# Patient Record
Sex: Male | Born: 1970 | Race: White | Hispanic: No | Marital: Married | Smoking: Never smoker
Health system: Southern US, Community
[De-identification: ages and names within clinical notes are randomized; demographics above are authoritative.]

## PROBLEM LIST (undated history)

## (undated) DIAGNOSIS — I10 Essential (primary) hypertension: Secondary | ICD-10-CM

## (undated) DIAGNOSIS — K219 Gastro-esophageal reflux disease without esophagitis: Secondary | ICD-10-CM

## (undated) HISTORY — PX: SMALL INTESTINE SURGERY: SHX150

---

## 2020-04-23 ENCOUNTER — Inpatient Hospital Stay (HOSPITAL_COMMUNITY)
Admission: AD | Admit: 2020-04-23 | Discharge: 2020-04-26 | DRG: 872 | Disposition: A | Discharge status: Home / Self Care | Payer: BC Managed Care – PPO | Source: Other Acute Inpatient Hospital | Attending: Internal Medicine | Admitting: Internal Medicine

## 2020-04-23 DIAGNOSIS — M5416 Radiculopathy, lumbar region: Secondary | ICD-10-CM | POA: Diagnosis present

## 2020-04-23 DIAGNOSIS — F1090 Alcohol use, unspecified, uncomplicated: Secondary | ICD-10-CM

## 2020-04-23 DIAGNOSIS — K219 Gastro-esophageal reflux disease without esophagitis: Secondary | ICD-10-CM | POA: Diagnosis present

## 2020-04-23 DIAGNOSIS — R748 Abnormal levels of other serum enzymes: Secondary | ICD-10-CM | POA: Diagnosis present

## 2020-04-23 DIAGNOSIS — R234 Changes in skin texture: Secondary | ICD-10-CM | POA: Diagnosis present

## 2020-04-23 DIAGNOSIS — Z7289 Other problems related to lifestyle: Secondary | ICD-10-CM

## 2020-04-23 DIAGNOSIS — F1020 Alcohol dependence, uncomplicated: Secondary | ICD-10-CM | POA: Diagnosis present

## 2020-04-23 DIAGNOSIS — A419 Sepsis, unspecified organism: Secondary | ICD-10-CM | POA: Diagnosis present

## 2020-04-23 DIAGNOSIS — N492 Inflammatory disorders of scrotum: Secondary | ICD-10-CM | POA: Diagnosis present

## 2020-04-23 DIAGNOSIS — Z789 Other specified health status: Secondary | ICD-10-CM

## 2020-04-23 DIAGNOSIS — N433 Hydrocele, unspecified: Secondary | ICD-10-CM | POA: Diagnosis present

## 2020-04-23 DIAGNOSIS — K7689 Other specified diseases of liver: Secondary | ICD-10-CM | POA: Diagnosis present

## 2020-04-23 DIAGNOSIS — N5089 Other specified disorders of the male genital organs: Secondary | ICD-10-CM

## 2020-04-23 DIAGNOSIS — K5900 Constipation, unspecified: Secondary | ICD-10-CM | POA: Diagnosis present

## 2020-04-23 DIAGNOSIS — D72829 Elevated white blood cell count, unspecified: Secondary | ICD-10-CM | POA: Diagnosis not present

## 2020-04-23 DIAGNOSIS — I10 Essential (primary) hypertension: Secondary | ICD-10-CM | POA: Diagnosis present

## 2020-04-23 HISTORY — DX: Gastro-esophageal reflux disease without esophagitis: K21.9

## 2020-04-23 HISTORY — DX: Essential (primary) hypertension: I10

## 2020-04-23 NOTE — H&P (Signed)
History and Physical    Andrew Roberts NWG:[id number] DOB: 05/10/1970 DOA: 04/23/2020  PCP: No primary care provider on file. Patient coming from: [facility]  Chief Complaint: Scrotal pain and swelling  HPI: Andrew Roberts is a 50 y.o. male with medical history significant of hypertension, GERD presented to [facility] ED today with complaints of worsening swelling, erythema, and pain of his scrotum.  He was seen by his PCP several days ago for suspected simple abscess/cellulitis and placed on antibiotics but the infection continued to worsen.   At [facility], afebrile but tachycardic.  Not hypotensive.  Labs showing WBC 25.0, hemoglobin 14.0, platelet count 179K.  Sodium 133, potassium 4.8, chloride 99, bicarb 29, BUN 16, creatinine 1.0, glucose 115.  Lactic acid 1.9.  AST 99, ALT 216, alk phos 320, T bili 1.5.  UA with negative nitrite, negative leukocytes, 2 WBCs, and few bacteria.  Screening Covid and influenza test negative.   CT abdomen pelvis with contrast done at [facility] showing findings compatible with acute scrotal cellulitis with marked edema and skin thickening.  Associated reactive hydrocele.  Left hemiscrotal ill-defined fluid with enhancement, may represent reactive hydrocele or phlegmon versus early abscess.  Ultrasound recommended for further evaluation.  Scrotal ultrasound was done and revealed marked changes of right than left scrotal skin thickening with complex mixed echogenicity edema/ill-defined tracking scrotal wall fluid compatible with cellulitis and phlegmon reaction.  No defined fluid collection.  No testicular abnormality or evidence of torsion.  Small reactive right hydrocele.  Patient was given 1 L normal saline bolus, hydromorphone, vancomycin, morphine, and Zofran.  ED physician at [facility] discussed the case with on-call urologist Dr. Charlesetta Shanks at [facility] who recommended continuing IV antibiotic but if any signs of decompensation then  reimage and reconsult urology.  Patient states about 10 days ago he noticed an ingrown hair on his scrotum which he tried to remove on his own and clean the area with hydrogen peroxide.  The following day he noticed a small bump in this area.  Since then he has continued to have worsening erythema, pain, and swelling.  States last week he was seen at a clinic and prescribed Bactrim but it did not help and his infection has continued to worsen.  Denies dysuria.  He had chills yesterday but did not check his temperature. No nausea or vomiting.  He has received 1 dose of Moderna Covid vaccine.  Denies cough or shortness of breath.  He reports history of lumbar radiculopathy for which he is seen by orthopedics and receiving steroid injections.  Review of Systems:  All systems reviewed and apart from history of presenting illness, are negative.  Past Medical History:  Diagnosis Date  . GERD (gastroesophageal reflux disease)   . HTN (hypertension)     Past Surgical History:  Procedure Laterality Date  . SMALL INTESTINE SURGERY       reports that he has never smoked. He has never used smokeless tobacco. He reports current alcohol use. He reports current drug use. Drug: Marijuana.  Allergies  Allergen Reactions  . Aspirin     Other reaction(s): Other Ulcers  ulcers Ulcers      History reviewed. No pertinent family history.  Prior to Admission medications   Not on File    Physical Exam: Vitals:   04/23/20 2300  BP: (!) 151/86  Pulse: 99  Resp: 18  Temp: 98.7 F (37.1 C)  TempSrc: Oral  SpO2: 99%    Physical Exam Constitutional:  General: He is not in acute distress. HENT:     Head: Normocephalic and atraumatic.  Eyes:     Extraocular Movements: Extraocular movements intact.     Conjunctiva/sclera: Conjunctivae normal.  Cardiovascular:     Rate and Rhythm: Normal rate and regular rhythm.     Pulses: Normal pulses.  Pulmonary:     Effort: Pulmonary effort is  normal. No respiratory distress.     Breath sounds: Normal breath sounds. No wheezing or rales.  Abdominal:     General: Bowel sounds are normal. There is no distension.     Palpations: Abdomen is soft.     Tenderness: There is no abdominal tenderness.  Genitourinary:    Comments: Chaperone at bedside (nurse tech) Scrotum appears erythematous and edematous.  Erythema extending to perineal region and inner thigh Musculoskeletal:        General: No swelling or tenderness.     Cervical back: Normal range of motion and neck supple.  Skin:    General: Skin is warm and dry.  Neurological:     General: No focal deficit present.     Mental Status: He is alert and oriented to person, place, and time.     Labs on Admission: I have personally reviewed following labs and imaging studies  CBC: No results for input(s): WBC, NEUTROABS, HGB, HCT, MCV, PLT in the last 168 hours. Basic Metabolic Panel: No results for input(s): NA, K, CL, CO2, GLUCOSE, BUN, CREATININE, CALCIUM, MG, PHOS in the last 168 hours. GFR: CrCl cannot be calculated (No successful lab value found.). Liver Function Tests: No results for input(s): AST, ALT, ALKPHOS, BILITOT, PROT, ALBUMIN in the last 168 hours. No results for input(s): LIPASE, AMYLASE in the last 168 hours. No results for input(s): AMMONIA in the last 168 hours. Coagulation Profile: No results for input(s): INR, PROTIME in the last 168 hours. Cardiac Enzymes: No results for input(s): CKTOTAL, CKMB, CKMBINDEX, TROPONINI in the last 168 hours. BNP (last 3 results) No results for input(s): PROBNP in the last 8760 hours. HbA1C: No results for input(s): HGBA1C in the last 72 hours. CBG: No results for input(s): GLUCAP in the last 168 hours. Lipid Profile: No results for input(s): CHOL, HDL, LDLCALC, TRIG, CHOLHDL, LDLDIRECT in the last 72 hours. Thyroid Function Tests: No results for input(s): TSH, T4TOTAL, FREET4, T3FREE, THYROIDAB in the last 72  hours. Anemia Panel: No results for input(s): VITAMINB12, FOLATE, FERRITIN, TIBC, IRON, RETICCTPCT in the last 72 hours. Urine analysis: No results found for: COLORURINE, APPEARANCEUR, LABSPEC, PHURINE, GLUCOSEU, HGBUR, BILIRUBINUR, KETONESUR, PROTEINUR, UROBILINOGEN, NITRITE, LEUKOCYTESUR  Radiological Exams on Admission: No results found.  Assessment/Plan Principal Problem:   Cellulitis, scrotum Active Problems:   Sepsis ([place])   Elevated liver enzymes   Alcohol use   Essential hypertension   Sepsis secondary to acute scrotal cellulitis with phlegmon: Meets criteria for sepsis with tachycardia and leukocytosis.  No lactic acidosis or hypotension to suggest severe sepsis.  Scrotum is erythematous and edematous, erythema extending to perineal region and inner thigh.  UA not suggestive of infection.  Scrotal ultrasound done at [facility] showing marked changes of right than left scrotal skin thickening with complex mixed echogenicity edema/ill-defined tracking scrotal wall fluid compatible with cellulitis and phlegmon reaction; no defined fluid collection.  CT done at [facility] showing no findings to suggest Fournier's gangrene. ED physician at [facility] discussed the case with on-call urologist Dr. Charlesetta Shanks at [facility] who recommended continuing IV antibiotic but if any signs of decompensation  then reimage and reconsult urology. -Continue vancomycin.  Repeat labs to check lactate and WBC count.  Order blood culture x2.  IV fluid hydration, pain management.  Elevated liver enzymes: AST 99, ALT 216, alk phos 320, T bili 1.5.  Patient reports history of daily alcohol use. -Right upper quadrant ultrasound  Alcohol use disorder: Patient reports drinking a pint of liquor every night.  Not displaying any signs of withdrawal at this time. -CIWA protocol; Ativan as needed.  Folate, thiamine, and multivitamin.  Hypertension: Stable. -Continue home losartan  Lumbar radiculopathy: Patient states  he seen by orthopedics on outpatient basis and is receiving steroid injections. -Continue home gabapentin, Zanaflex.  Outpatient follow-up with orthopedics.  DVT prophylaxis: Subcutaneous heparin Code Status: Full code Family Communication: No family at bedside. Disposition Plan: Status is: Inpatient  Remains inpatient appropriate because:Ongoing active pain requiring inpatient pain management and Inpatient level of care appropriate due to severity of illness   Dispo: The patient is from: Home              Anticipated d/c is to: Home              Patient currently is not medically stable to d/c.   Difficult to place patient No  Level of care: Telemetry  The medical decision making on this patient was of high complexity and the patient is at high risk for clinical deterioration, therefore this is a level 3 visit.  Shela Leff MD [facility]  If 7PM-7AM, please contact night-coverage [email or web address]  04/24/2020, 1:21 AM

## 2020-04-24 ENCOUNTER — Encounter (HOSPITAL_COMMUNITY): Payer: Self-pay | Admitting: Internal Medicine

## 2020-04-24 ENCOUNTER — Other Ambulatory Visit: Payer: Self-pay

## 2020-04-24 ENCOUNTER — Inpatient Hospital Stay (HOSPITAL_COMMUNITY): Payer: BC Managed Care – PPO

## 2020-04-24 DIAGNOSIS — D72829 Elevated white blood cell count, unspecified: Secondary | ICD-10-CM | POA: Diagnosis present

## 2020-04-24 DIAGNOSIS — A419 Sepsis, unspecified organism: Principal | ICD-10-CM | POA: Diagnosis present

## 2020-04-24 DIAGNOSIS — I10 Essential (primary) hypertension: Secondary | ICD-10-CM

## 2020-04-24 DIAGNOSIS — Z789 Other specified health status: Secondary | ICD-10-CM

## 2020-04-24 DIAGNOSIS — N492 Inflammatory disorders of scrotum: Secondary | ICD-10-CM | POA: Diagnosis present

## 2020-04-24 DIAGNOSIS — R748 Abnormal levels of other serum enzymes: Secondary | ICD-10-CM

## 2020-04-24 DIAGNOSIS — Z7289 Other problems related to lifestyle: Secondary | ICD-10-CM

## 2020-04-24 DIAGNOSIS — M5416 Radiculopathy, lumbar region: Secondary | ICD-10-CM | POA: Diagnosis present

## 2020-04-24 LAB — URINALYSIS, ROUTINE W REFLEX MICROSCOPIC
Bacteria, UA: NONE SEEN
Bilirubin Urine: NEGATIVE
Glucose, UA: NEGATIVE mg/dL
Ketones, ur: NEGATIVE mg/dL
Leukocytes,Ua: NEGATIVE
Nitrite: NEGATIVE
Protein, ur: 30 mg/dL — AB
Specific Gravity, Urine: 1.021 (ref 1.005–1.030)
pH: 6 (ref 5.0–8.0)

## 2020-04-24 LAB — CBC
HCT: 37.5 % — ABNORMAL LOW (ref 39.0–52.0)
Hemoglobin: 12.5 g/dL — ABNORMAL LOW (ref 13.0–17.0)
MCH: 31.9 pg (ref 26.0–34.0)
MCHC: 33.3 g/dL (ref 30.0–36.0)
MCV: 95.7 fL (ref 80.0–100.0)
Platelets: 170 10*3/uL (ref 150–400)
RBC: 3.92 MIL/uL — ABNORMAL LOW (ref 4.22–5.81)
RDW: 13.7 % (ref 11.5–15.5)
WBC: 19.4 10*3/uL — ABNORMAL HIGH (ref 4.0–10.5)
nRBC: 0 % (ref 0.0–0.2)

## 2020-04-24 LAB — COMPREHENSIVE METABOLIC PANEL
ALT: 144 U/L — ABNORMAL HIGH (ref 0–44)
AST: 52 U/L — ABNORMAL HIGH (ref 15–41)
Albumin: 2.8 g/dL — ABNORMAL LOW (ref 3.5–5.0)
Alkaline Phosphatase: 190 U/L — ABNORMAL HIGH (ref 38–126)
Anion gap: 7 (ref 5–15)
BUN: 15 mg/dL (ref 6–20)
CO2: 22 mmol/L (ref 22–32)
Calcium: 8.3 mg/dL — ABNORMAL LOW (ref 8.9–10.3)
Chloride: 104 mmol/L (ref 98–111)
Creatinine, Ser: 1.04 mg/dL (ref 0.61–1.24)
GFR, Estimated: >60 mL/min (ref >60)
Glucose, Bld: 153 mg/dL — ABNORMAL HIGH (ref 70–99)
Potassium: 4.2 mmol/L (ref 3.5–5.1)
Sodium: 133 mmol/L — ABNORMAL LOW (ref 135–145)
Total Bilirubin: 1 mg/dL (ref 0.3–1.2)
Total Protein: 6.1 g/dL — ABNORMAL LOW (ref 6.5–8.1)

## 2020-04-24 LAB — PHOSPHORUS: Phosphorus: 2.6 mg/dL (ref 2.5–4.6)

## 2020-04-24 LAB — MAGNESIUM: Magnesium: 2 mg/dL (ref 1.7–2.4)

## 2020-04-24 LAB — HIV ANTIBODY (ROUTINE TESTING W REFLEX): HIV Screen 4th Generation wRfx: NONREACTIVE

## 2020-04-24 LAB — LACTIC ACID, PLASMA: Lactic Acid, Venous: 1.2 mmol/L (ref 0.5–1.9)

## 2020-04-24 MED ORDER — SODIUM CHLORIDE 0.9 % IV SOLN
3.0000 g | Freq: Four times a day (QID) | INTRAVENOUS | Status: DC
  Administered 2020-04-24 – 2020-04-26 (×9): 3 g via INTRAVENOUS
  Filled 2020-04-24 (×2): qty 3
  Filled 2020-04-24: qty 8
  Filled 2020-04-24: qty 2.25
  Filled 2020-04-24: qty 3
  Filled 2020-04-24: qty 2.25
  Filled 2020-04-24: qty 3
  Filled 2020-04-24: qty 2.25
  Filled 2020-04-24: qty 3
  Filled 2020-04-24: qty 2.25

## 2020-04-24 MED ORDER — THIAMINE HCL 100 MG/ML IJ SOLN: 100.0000 mg | Freq: Every day | INTRAMUSCULAR | Status: DC

## 2020-04-24 MED ORDER — VANCOMYCIN HCL 750 MG/150ML IV SOLN
750.0000 mg | Freq: Two times a day (BID) | INTRAVENOUS | Status: DC
  Administered 2020-04-24 – 2020-04-25 (×3): 750 mg via INTRAVENOUS
  Filled 2020-04-24 (×4): qty 150

## 2020-04-24 MED ORDER — TRAMADOL HCL 50 MG PO TABS
50.0000 mg | ORAL_TABLET | Freq: Three times a day (TID) | ORAL | Status: DC
  Administered 2020-04-24 – 2020-04-25 (×6): 50 mg via ORAL
  Filled 2020-04-24 (×6): qty 1

## 2020-04-24 MED ORDER — LORAZEPAM 2 MG/ML IJ SOLN: 1.0000 mg | INTRAMUSCULAR | Status: DC | PRN

## 2020-04-24 MED ORDER — GABAPENTIN 300 MG PO CAPS
600.0000 mg | ORAL_CAPSULE | Freq: Two times a day (BID) | ORAL | Status: DC
  Administered 2020-04-24 – 2020-04-26 (×6): 600 mg via ORAL
  Filled 2020-04-24 (×7): qty 2

## 2020-04-24 MED ORDER — KETOROLAC TROMETHAMINE 30 MG/ML IJ SOLN: 30.0000 mg | Freq: Four times a day (QID) | INTRAMUSCULAR | Status: DC | PRN

## 2020-04-24 MED ORDER — SODIUM CHLORIDE 0.9 % IV BOLUS: 1000.0000 mL | Freq: Once | INTRAVENOUS | Status: DC

## 2020-04-24 MED ORDER — LOSARTAN POTASSIUM 50 MG PO TABS
100.0000 mg | ORAL_TABLET | Freq: Every day | ORAL | Status: DC
  Administered 2020-04-24 – 2020-04-26 (×3): 100 mg via ORAL
  Filled 2020-04-24 (×3): qty 2

## 2020-04-24 MED ORDER — LORAZEPAM 1 MG PO TABS: 1.0000 mg | ORAL_TABLET | ORAL | Status: DC | PRN

## 2020-04-24 MED ORDER — FOLIC ACID 1 MG PO TABS: 1.0000 mg | ORAL_TABLET | Freq: Every day | ORAL | Status: DC

## 2020-04-24 MED ORDER — SODIUM CHLORIDE 0.9 % IV SOLN: INTRAVENOUS | Status: AC

## 2020-04-24 MED ORDER — THIAMINE HCL 100 MG PO TABS
100.0000 mg | ORAL_TABLET | Freq: Every day | ORAL | Status: DC
  Administered 2020-04-25: 100 mg via ORAL
  Filled 2020-04-24 (×2): qty 1

## 2020-04-24 MED ORDER — SENNOSIDES-DOCUSATE SODIUM 8.6-50 MG PO TABS
1.0000 | ORAL_TABLET | Freq: Two times a day (BID) | ORAL | Status: DC
  Administered 2020-04-24 – 2020-04-26 (×4): 1 via ORAL
  Filled 2020-04-24 (×4): qty 1

## 2020-04-24 MED ORDER — FOLIC ACID 1 MG PO TABS
1.0000 mg | ORAL_TABLET | Freq: Every day | ORAL | Status: DC
  Administered 2020-04-24 – 2020-04-26 (×3): 1 mg via ORAL
  Filled 2020-04-24 (×3): qty 1

## 2020-04-24 MED ORDER — POLYETHYLENE GLYCOL 3350 17 G PO PACK: 17.0000 g | PACK | Freq: Two times a day (BID) | ORAL | Status: DC

## 2020-04-24 MED ORDER — POLYETHYLENE GLYCOL 3350 17 G PO PACK
17.0000 g | PACK | Freq: Every day | ORAL | Status: DC
  Administered 2020-04-24 – 2020-04-25 (×2): 17 g via ORAL
  Filled 2020-04-24 (×3): qty 1

## 2020-04-24 MED ORDER — PANTOPRAZOLE SODIUM 40 MG PO TBEC
40.0000 mg | DELAYED_RELEASE_TABLET | Freq: Every day | ORAL | Status: DC
  Administered 2020-04-24 – 2020-04-26 (×3): 40 mg via ORAL
  Filled 2020-04-24 (×3): qty 1

## 2020-04-24 MED ORDER — TIZANIDINE HCL 4 MG PO TABS: 4.0000 mg | ORAL_TABLET | Freq: Three times a day (TID) | ORAL | Status: DC | PRN

## 2020-04-24 MED ORDER — THIAMINE HCL 100 MG PO TABS
100.0000 mg | ORAL_TABLET | Freq: Every day | ORAL | Status: DC
  Administered 2020-04-24 – 2020-04-26 (×2): 100 mg via ORAL
  Filled 2020-04-24: qty 1

## 2020-04-24 MED ORDER — LORAZEPAM 2 MG/ML IJ SOLN: 0.0000 mg | Freq: Two times a day (BID) | INTRAMUSCULAR | Status: DC

## 2020-04-24 MED ORDER — HEPARIN SODIUM (PORCINE) 5000 UNIT/ML IJ SOLN
5000.0000 [IU] | Freq: Three times a day (TID) | INTRAMUSCULAR | Status: DC
  Administered 2020-04-24 – 2020-04-25 (×4): 5000 [IU] via SUBCUTANEOUS
  Filled 2020-04-24 (×4): qty 1

## 2020-04-24 MED ORDER — ADULT MULTIVITAMIN W/MINERALS CH
1.0000 | ORAL_TABLET | Freq: Every day | ORAL | Status: DC
  Administered 2020-04-25: 1 via ORAL
  Filled 2020-04-24 (×3): qty 1

## 2020-04-24 MED ORDER — LORAZEPAM 2 MG/ML IJ SOLN: 0.0000 mg | Freq: Four times a day (QID) | INTRAMUSCULAR | Status: AC

## 2020-04-24 MED ORDER — ADULT MULTIVITAMIN W/MINERALS CH
1.0000 | ORAL_TABLET | Freq: Every day | ORAL | Status: DC
  Administered 2020-04-24 – 2020-04-26 (×2): 1 via ORAL
  Filled 2020-04-24 (×2): qty 1

## 2020-04-24 MED ORDER — SORBITOL 70 % SOLN
30.0000 mL | Freq: Once | Status: AC
  Administered 2020-04-24: 30 mL via ORAL
  Filled 2020-04-24: qty 30

## 2020-04-24 MED ORDER — MORPHINE SULFATE (PF) 2 MG/ML IV SOLN
1.0000 mg | INTRAVENOUS | Status: DC | PRN
  Administered 2020-04-24 – 2020-04-26 (×8): 1 mg via INTRAVENOUS
  Filled 2020-04-24 (×8): qty 1

## 2020-04-24 MED ORDER — GADOBUTROL 1 MMOL/ML IV SOLN
9.0000 mL | Freq: Once | INTRAVENOUS | Status: AC | PRN
  Administered 2020-04-24: 9 mL via INTRAVENOUS

## 2020-04-24 NOTE — Progress Notes (Signed)
Pharmacy Antibiotic Note  Andrew Roberts is a 50 y.o. male admitted on 04/23/2020 with scrotal cellulitis.  Pharmacy has been consulted for Vancomycin dosing. Received 2000mg  IV Vancomycin loading dose at 1240 yesterday prior to  transfer from [facility] . Also received Invanz & Clindamycin ~9pm.    CT at [facility] negative for Fournier's gangrene.   Plan: Vancomycin 750mg  IV q12h  Check Vancomycin trough at steady state Monitor renal function and cx data      Temp (24hrs), Avg:98.7 F (37.1 C), Min:98.7 F (37.1 C), Max:98.7 F (37.1 C)  No results for input(s): WBC, CREATININE, LATICACIDVEN, VANCOTROUGH, VANCOPEAK, VANCORANDOM, GENTTROUGH, GENTPEAK, GENTRANDOM, TOBRATROUGH, TOBRAPEAK, TOBRARND, AMIKACINPEAK, AMIKACINTROU, AMIKACIN in the last 168 hours.  CrCl cannot be calculated (No successful lab value found.).    Allergies  Allergen Reactions  . Aspirin     Other reaction(s): Other Ulcers  ulcers Ulcers      Antimicrobials this admission: 4/1 Vancomycin >>  7/1 Invanz + Clinda x1 at [facility] ED  Dose adjustments this admission:  Microbiology results:  Thank you for allowing pharmacy to be a part of this patient's care.  9/1 PharmD, BCPS 04/24/2020 1:57 AM

## 2020-04-24 NOTE — Progress Notes (Signed)
PROGRESS NOTE    Andrew Roberts  RJJ:[id number] DOB: 07/28/1970 DOA: 04/23/2020 PCP: No primary care provider on file.    No chief complaint on file.   Brief Narrative: HPI per Dr. Marlowe Roberts Andrew Roberts is a 50 y.o. male with medical history significant of hypertension, GERD presented to [facility] ED today with complaints of worsening swelling, erythema, and pain of his scrotum.  He was seen by his PCP several days ago for suspected simple abscess/cellulitis and placed on antibiotics but the infection continued to worsen.   At [facility], afebrile but tachycardic.  Not hypotensive.  Labs showing WBC 25.0, hemoglobin 14.0, platelet count 179K.  Sodium 133, potassium 4.8, chloride 99, bicarb 29, BUN 16, creatinine 1.0, glucose 115.  Lactic acid 1.9.  AST 99, ALT 216, alk phos 320, T bili 1.5.  UA with negative nitrite, negative leukocytes, 2 WBCs, and few bacteria.  Screening Covid and influenza test negative.   CT abdomen pelvis with contrast done at [facility] showing findings compatible with acute scrotal cellulitis with marked edema and skin thickening.  Associated reactive hydrocele.  Left hemiscrotal ill-defined fluid with enhancement, may represent reactive hydrocele or phlegmon versus early abscess.  Ultrasound recommended for further evaluation.  Scrotal ultrasound was done and revealed marked changes of right than left scrotal skin thickening with complex mixed echogenicity edema/ill-defined tracking scrotal wall fluid compatible with cellulitis and phlegmon reaction.  No defined fluid collection.  No testicular abnormality or evidence of torsion.  Small reactive right hydrocele.  Patient was given 1 L normal saline bolus, hydromorphone, vancomycin, morphine, and Zofran.  ED physician at [facility] discussed the case with on-call urologist Dr. Charlesetta Roberts at [facility] who recommended continuing IV antibiotic but if any signs of decompensation then reimage and  reconsult urology.  Patient states about 10 days ago he noticed an ingrown hair on his scrotum which he tried to remove on his own and clean the area with hydrogen peroxide.  The following day he noticed a small bump in this area.  Since then he has continued to have worsening erythema, pain, and swelling.  States last week he was seen at a clinic and prescribed Bactrim but it did not help and his infection has continued to worsen.  Denies dysuria.  He had chills yesterday but did not check his temperature. No nausea or vomiting.  He has received 1 dose of Moderna Covid vaccine.  Denies cough or shortness of breath.  He reports history of lumbar radiculopathy for which he is seen by orthopedics and receiving steroid injections.  Assessment & Plan:   Principal Problem:   Cellulitis, scrotum Active Problems:   Sepsis ([place])   Elevated liver enzymes   Alcohol use   Essential hypertension  1 sepsis secondary to acute scrotal cellulitis with phlegmon -On admission patient met criteria for sepsis with tachycardia, leukocytosis, scrotal cellulitis. -Scrotum noted to be edematous, erythematous extending to the perineal region and inner thigh on presentation with some exquisite right inguinal tenderness to palpation and induration. -Urinalysis done not suggestive of infection. -Scrotal ultrasound done at [facility] showed marked changes of right and left scrotal skin thickening with complex mixed echogenicity edema/ill-defined tracking scrotal wall fluid compatible with cellulitis and phlegmon reaction, no defined fluid collection. -CT done at [facility] with no findings to suggest Fournier's gangrene. -Patient with some exquisite tenderness in induration in the GU region. -Consult with urology for further evaluation and management and assessment to determine whether patient needs I and  D. -Continue IV vancomycin -Add IV Unasyn for gram-negative and anaerobic coverage. -Gentle  hydration -Pain management.  2.  Lumbar radiculopathy -Patient and wife seem frustrated by work-up in the outpatient setting by orthopedics for lumbar radiculopathy. -Family MRI was to be obtained however has been taking a while. -Has been noted that patient has received some steroid injections. -Patient still with significant pain which is described as radiating from right buttocks region down his lower extremity to his anterior shin and dorsal aspect of foot. -Check MRI of the L-spine. -Place on scheduled Ultram 3 times daily.  IV Toradol as needed -Place on PPI -Continue home regimen gabapentin, Zanaflex.  3.  Hypertension Continue home regimen losartan.  4.  Transaminitis -Patient noted to have daily alcohol use. -Right upper quadrant ultrasound obtained this morning with no acute hepatobiliary findings.  No cholelithiasis.  1.7 cm liver cyst. -Check an acute hepatitis panel. -Repeat labs in the morning.  5.  Leukocytosis Likely secondary to problem #1. -Blood cultures ordered and pending.  Urine cultures pending. -Continue IV vancomycin.  Add IV Unasyn. -Follow.  6.  Chronic alcohol use -Place on the Ativan withdrawal protocol, thiamine, folic acid, multivitamin. -Supportive care.   DVT prophylaxis: Heparin >>>> Lovenox  Code Status: Full Family Communication: Updated patient and wife at bedside. Disposition:   Status is: Inpatient    Dispo: The patient is from: Home              Anticipated d/c is to: Home              Patient currently with scrotal cellulitis, some induration in the scrotal region, on IV antibiotics, urology consultation pending.   Difficult to place patient No       Consultants:   Urology pending  Procedures:   MRI L-spine pending   Antimicrobials:   IV vancomycin 04/24/2020>>>>  IV Unasyn 04/24/2020>>>>   Subjective: Patient sitting up on the bedside, eating breakfast.  Complain of low back pain with radiation down his right  lower extremity..  Patient complaining of some scrotal pain and swelling that he feels has improved slightly from admission.  Objective: Vitals:   04/23/20 2300 04/24/20 0404  BP: (!) 151/86 119/60  Pulse: 99 79  Resp: 18 17  Temp: 98.7 F (37.1 C) 98.3 F (36.8 C)  TempSrc: Oral Oral  SpO2: 99% 99%   No intake or output data in the 24 hours ending 04/24/20 1056 There were no vitals filed for this visit.  Examination:  General exam: Appears calm and comfortable  Respiratory system: Clear to auscultation. Respiratory effort normal. Cardiovascular system: S1 & S2 heard, RRR. No JVD, murmurs, rubs, gallops or clicks. No pedal edema. Gastrointestinal system: Abdomen is nondistended, soft and nontender. No organomegaly or masses felt. Normal bowel sounds heard. Central nervous system: Alert and oriented. No focal neurological deficits. Genitourinary: Scrotum with erythema, swelling and tightness right greater than left standing into perineal region and inner thigh, right inguinal region exquisitely tender to palpation with induration. Extremities: Symmetric 5 x 5 power. Skin: No rashes, lesions or ulcers Psychiatry: Judgement and insight appear normal. Mood & affect appropriate.     Data Reviewed: I have personally reviewed following labs and imaging studies  CBC: Recent Labs  Lab 04/24/20 0216  WBC 19.4*  HGB 12.5*  HCT 37.5*  MCV 95.7  PLT 371    Basic Metabolic Panel: Recent Labs  Lab 04/24/20 0216  NA 133*  K 4.2  CL 104  CO2  22  GLUCOSE 153*  BUN 15  CREATININE 1.04  CALCIUM 8.3*  MG 2.0  PHOS 2.6    GFR: CrCl cannot be calculated (Unknown ideal weight.).  Liver Function Tests: Recent Labs  Lab 04/24/20 0216  AST 52*  ALT 144*  ALKPHOS 190*  BILITOT 1.0  PROT 6.1*  ALBUMIN 2.8*    CBG: No results for input(s): GLUCAP in the last 168 hours.   No results found for this or any previous visit (from the past 240 hour(s)).       Radiology  Studies: US Abdomen Limited RUQ (LIVER/GB)  Result Date: 04/24/2020 CLINICAL DATA:  Elevated LFTs. EXAM: ULTRASOUND ABDOMEN LIMITED RIGHT UPPER QUADRANT COMPARISON:  CT 04/23/2020 FINDINGS: Gallbladder: No gallstones or wall thickening visualized. No sonographic Murphy sign noted by sonographer. Common bile duct: Diameter: 3 mm. Liver: Parenchymal echogenicity within normal. 1.7 cm cyst over the left lobe. Portal vein is patent on color Doppler imaging with normal direction of blood flow towards the liver. Other: None. IMPRESSION: 1.  No acute hepatobiliary findings.  No cholelithiasis. 2.  1.7 cm liver cyst. Electronically Signed   By: Marin Olp M.D.   On: 04/24/2020 10:05        Scheduled Meds: . folic acid  1 mg Oral Daily  . gabapentin  600 mg Oral BID  . heparin  5,000 Units Subcutaneous Q8H  . LORazepam  0-4 mg Intravenous Q6H   Followed by  . [START ON 04/26/2020] LORazepam  0-4 mg Intravenous Q12H  . losartan  100 mg Oral Daily  . multivitamin with minerals  1 tablet Oral Daily  . multivitamin with minerals  1 tablet Oral Daily  . thiamine  100 mg Oral Daily   Or  . thiamine  100 mg Intravenous Daily  . thiamine  100 mg Oral Daily   Or  . thiamine  100 mg Intravenous Daily   Continuous Infusions: . sodium chloride 125 mL/hr at 04/24/20 0202  . ampicillin-sulbactam (UNASYN) IV    . vancomycin 750 mg (04/24/20 0412)     LOS: 1 day    Time spent: 40 minutes    Irine Seal, MD [facility]   To contact the attending provider between 7A-7P or the covering provider during after hours 7P-7A, please log into the web site [email or web address] and access using universal [place] password for that web site. If you do not have the password, please call the hospital operator.  04/24/2020, 10:56 AM

## 2020-04-24 NOTE — Consult Note (Signed)
Urology Consult   Physician requesting consult: Ramiro Harvest, MD  Reason for consult: Scrotal cellulitis   History of Present Illness: Andrew Roberts is a 50 y.o. male with history of HTN, back pain, and GERD who was transferred to [facility] on 04/23/20 for worsening swelling, erythema, and pain in his scrotum concerning for cellulitis.   He noticed an ingrown hair on his scrotal ~10 days ago and attempted to remove it on his own. He subsequently noticed a small bump in the area with progressively worsening erythema, pain, and swelling. He was seen by his PCP several days ago for suspected simple abscess/cellulitis and placed on Bactrim, but the infection continued to worsen. No fevers. He presented to an OSH ED for his symptoms.   On presentation to OSH ED, he was afebrile and normotensive but tachycardic. Leukocytosis to 25. Lactic acid 1.9. Normal Cr. UA with negative nitrite, negative leukocytes, 2 WBCs, and few bacteria. Unsure if Ucx performed at OSH ED. CT A/P c/w acute scrotal cellulitis with marked scrotal edema and skin thickening as well as left hemiscrotal ill-defined fluid with enhancement that may represent reactive hydrocele, phlegmon, or early abscess. No air. Scrotal ultrasound c/w cellulitis and phlegmon reaction without defined fluid collection. No testicular abnormality. Images available per inteleconnect.  He was given 1L NS bolus and started on vancomycin. He was transferred to [facility] for further management. Repeat labs showed downtrending leukocytosis to ~19. Blood cultures x 2 were collected. Since transfer, he has remained afebrile and normotensive. He's had isolated tachycardia to 102.   Since admission, he reports mild improvement in his left hemiscrotal swelling and pain. Still with significant right scrotal pain and tightness that has remained stable.   Denies history of DM or immunocompromising illness. Denies prior urologic history or surgery.   Past Medical History:   Diagnosis Date  . GERD (gastroesophageal reflux disease)   . HTN (hypertension)     Past Surgical History:  Procedure Laterality Date  . SMALL INTESTINE SURGERY      Current Hospital Medications:  Home Meds:  No current facility-administered medications on file prior to encounter.   Current Outpatient Medications on File Prior to Encounter  Medication Sig Dispense Refill  . cholecalciferol (VITAMIN D3) 25 MCG (1000 UNIT) tablet Take 2,000 Units by mouth daily.    Marland Kitchen gabapentin (NEURONTIN) 600 MG tablet Take 600 mg by mouth 2 (two) times daily.    Marland Kitchen losartan (COZAAR) 100 MG tablet Take 100 mg by mouth daily.    . magnesium 30 MG tablet Take 30 mg by mouth daily.    . methocarbamol (ROBAXIN) 500 MG tablet Take 500 mg by mouth 4 (four) times daily.    Marland Kitchen tiZANidine (ZANAFLEX) 4 MG tablet Take 4-8 mg by mouth 3 (three) times daily.    Marland Kitchen zinc gluconate 50 MG tablet Take 50 mg by mouth daily.       Scheduled Meds: . folic acid  1 mg Oral Daily  . gabapentin  600 mg Oral BID  . heparin  5,000 Units Subcutaneous Q8H  . LORazepam  0-4 mg Intravenous Q6H   Followed by  . [START ON 04/26/2020] LORazepam  0-4 mg Intravenous Q12H  . losartan  100 mg Oral Daily  . multivitamin with minerals  1 tablet Oral Daily  . multivitamin with minerals  1 tablet Oral Daily  . pantoprazole  40 mg Oral Q0600  . thiamine  100 mg Oral Daily   Or  . thiamine  100  mg Intravenous Daily  . thiamine  100 mg Oral Daily   Or  . thiamine  100 mg Intravenous Daily  . traMADol  50 mg Oral TID   Continuous Infusions: . sodium chloride 125 mL/hr at 04/24/20 0202  . ampicillin-sulbactam (UNASYN) IV 3 g (04/24/20 1152)  . vancomycin 750 mg (04/24/20 0412)   PRN Meds:.LORazepam **OR** LORazepam, morphine injection, tiZANidine  Allergies:  Allergies  Allergen Reactions  . Aspirin     Other reaction(s): Other Ulcers  ulcers Ulcers      History reviewed. No pertinent family history.  Social History:   reports that he has never smoked. He has never used smokeless tobacco. He reports current alcohol use. He reports current drug use. Drug: Marijuana.  ROS: Pertinent positives and negatives per HPI  Physical Exam:  Vital signs in last 24 hours: Temp:  [98.2 F (36.8 C)-98.7 F (37.1 C)] 98.2 F (36.8 C) (04/02 1301) Pulse Rate:  [79-102] 102 (04/02 1301) Resp:  [15-18] 15 (04/02 1301) BP: (119-152)/(60-87) 152/87 (04/02 1301) SpO2:  [99 %-100 %] 100 % (04/02 1301) Constitutional:  Alert and oriented, laying in bed in no acute distress Cardiovascular: Regular rate  Respiratory: Normal respiratory effort on RA GI: Abdomen is soft, nontender, nondistended GU: Normal circumcised phallus with normal urethral meatus. Erythematous rash over suprapubic region extended over bilateral groins and scrotum to perineum. Tight, tender right hemiscrotum without induration, about size of orange, likely due to reactive hydrocele, unable to palpate right testicle. Right groin swelling and tenderness. Soft left hemiscrotum that is less tender compared to right, able to palpate testicle without associated tenderness. No crepitus  Neurologic: Grossly intact, no focal deficits Psychiatric: Normal mood and affect  Laboratory Data:  Recent Labs    04/24/20 0216  WBC 19.4*  HGB 12.5*  HCT 37.5*  PLT 170    Recent Labs    04/24/20 0216  NA 133*  K 4.2  CL 104  GLUCOSE 153*  BUN 15  CALCIUM 8.3*  CREATININE 1.04     Results for orders placed or performed during the hospital encounter of 04/23/20 (from the past 24 hour(s))  CBC     Status: Abnormal   Collection Time: 04/24/20  2:16 AM  Result Value Ref Range   WBC 19.4 (H) 4.0 - 10.5 K/uL   RBC 3.92 (L) 4.22 - 5.81 MIL/uL   Hemoglobin 12.5 (L) 13.0 - 17.0 g/dL   HCT 81.0 (L) 17.5 - 10.2 %   MCV 95.7 80.0 - 100.0 fL   MCH 31.9 26.0 - 34.0 pg   MCHC 33.3 30.0 - 36.0 g/dL   RDW 58.5 27.7 - 82.4 %   Platelets 170 150 - 400 K/uL   nRBC 0.0  0.0 - 0.2 %  Lactic acid, plasma     Status: None   Collection Time: 04/24/20  2:16 AM  Result Value Ref Range   Lactic Acid, Venous 1.2 0.5 - 1.9 mmol/L  Comprehensive metabolic panel     Status: Abnormal   Collection Time: 04/24/20  2:16 AM  Result Value Ref Range   Sodium 133 (L) 135 - 145 mmol/L   Potassium 4.2 3.5 - 5.1 mmol/L   Chloride 104 98 - 111 mmol/L   CO2 22 22 - 32 mmol/L   Glucose, Bld 153 (H) 70 - 99 mg/dL   BUN 15 6 - 20 mg/dL   Creatinine, Ser 2.35 0.61 - 1.24 mg/dL   Calcium 8.3 (L) 8.9 - 10.3 mg/dL  Total Protein 6.1 (L) 6.5 - 8.1 g/dL   Albumin 2.8 (L) 3.5 - 5.0 g/dL   AST 52 (H) 15 - 41 U/L   ALT 144 (H) 0 - 44 U/L   Alkaline Phosphatase 190 (H) 38 - 126 U/L   Total Bilirubin 1.0 0.3 - 1.2 mg/dL   GFR, Estimated >07 >37 mL/min   Anion gap 7 5 - 15  Magnesium     Status: None   Collection Time: 04/24/20  2:16 AM  Result Value Ref Range   Magnesium 2.0 1.7 - 2.4 mg/dL  Phosphorus     Status: None   Collection Time: 04/24/20  2:16 AM  Result Value Ref Range   Phosphorus 2.6 2.5 - 4.6 mg/dL   No results found for this or any previous visit (from the past 240 hour(s)).  Renal Function: Recent Labs    04/24/20 0216  CREATININE 1.04   CrCl cannot be calculated (Unknown ideal weight.).  Radiologic Imaging: US Abdomen Limited RUQ (LIVER/GB)  Result Date: 04/24/2020 CLINICAL DATA:  Elevated LFTs. EXAM: ULTRASOUND ABDOMEN LIMITED RIGHT UPPER QUADRANT COMPARISON:  CT 04/23/2020 FINDINGS: Gallbladder: No gallstones or wall thickening visualized. No sonographic Murphy sign noted by sonographer. Common bile duct: Diameter: 3 mm. Liver: Parenchymal echogenicity within normal. 1.7 cm cyst over the left lobe. Portal vein is patent on color Doppler imaging with normal direction of blood flow towards the liver. Other: None. IMPRESSION: 1.  No acute hepatobiliary findings.  No cholelithiasis. 2.  1.7 cm liver cyst. Electronically Signed   By: Elberta Fortis M.D.   On:  04/24/2020 10:05   CT A/P 04/23/20:  Scrotal US 04/23/20:     Impression/Recommendation Andrew Roberts is a 50 y.o. male with history of HTN, back pain, and GERD who was admitted on 04/23/20 for worsening swelling, erythema, and pain in his scrotum with imaging and physical exam consistent with scrotal cellulitis. No drainable fluid collection on imaging. No evidence of fournier's gangrene on imaging or physical exam. Patient appears to be improving clinically on empiric vancomycin with downtrending leukocytosis and improving left hemiscrotal pain; although, his right hemiscrotal pain is stable. AFHDS.   - Agree with empiric treatment of cellulitis with vancomycin - Serial scrotal exams. If patient clinically worsens, recommend repeat scrotal ultrasound to evaluate for new abscess formation and/or gas - Follow up cultures and narrow/adjust antibiotics accordingly - NSAIDs, scrotal support, and ice PRN for pain  - We will continue to follow along    Margette Fast 04/24/2020, 1:06 PM

## 2020-04-25 ENCOUNTER — Inpatient Hospital Stay (HOSPITAL_COMMUNITY): Payer: BC Managed Care – PPO

## 2020-04-25 LAB — CBC WITH DIFFERENTIAL/PLATELET
Abs Immature Granulocytes: 0.16 10*3/uL — ABNORMAL HIGH (ref 0.00–0.07)
Basophils Absolute: 0.1 10*3/uL (ref 0.0–0.1)
Basophils Relative: 0 %
Eosinophils Absolute: 0.1 10*3/uL (ref 0.0–0.5)
Eosinophils Relative: 1 %
HCT: 40.4 % (ref 39.0–52.0)
Hemoglobin: 13 g/dL (ref 13.0–17.0)
Immature Granulocytes: 1 %
Lymphocytes Relative: 7 %
Lymphs Abs: 1.1 10*3/uL (ref 0.7–4.0)
MCH: 31.6 pg (ref 26.0–34.0)
MCHC: 32.2 g/dL (ref 30.0–36.0)
MCV: 98.1 fL (ref 80.0–100.0)
Monocytes Absolute: 0.9 10*3/uL (ref 0.1–1.0)
Monocytes Relative: 5 %
Neutro Abs: 14.6 10*3/uL — ABNORMAL HIGH (ref 1.7–7.7)
Neutrophils Relative %: 86 %
Platelets: 211 10*3/uL (ref 150–400)
RBC: 4.12 MIL/uL — ABNORMAL LOW (ref 4.22–5.81)
RDW: 13.8 % (ref 11.5–15.5)
WBC: 16.9 10*3/uL — ABNORMAL HIGH (ref 4.0–10.5)
nRBC: 0 % (ref 0.0–0.2)

## 2020-04-25 LAB — COMPREHENSIVE METABOLIC PANEL
ALT: 106 U/L — ABNORMAL HIGH (ref 0–44)
AST: 30 U/L (ref 15–41)
Albumin: 2.8 g/dL — ABNORMAL LOW (ref 3.5–5.0)
Alkaline Phosphatase: 255 U/L — ABNORMAL HIGH (ref 38–126)
Anion gap: 8 (ref 5–15)
BUN: 13 mg/dL (ref 6–20)
CO2: 26 mmol/L (ref 22–32)
Calcium: 8.8 mg/dL — ABNORMAL LOW (ref 8.9–10.3)
Chloride: 103 mmol/L (ref 98–111)
Creatinine, Ser: 0.93 mg/dL (ref 0.61–1.24)
GFR, Estimated: >60 mL/min (ref >60)
Glucose, Bld: 93 mg/dL (ref 70–99)
Potassium: 4.5 mmol/L (ref 3.5–5.1)
Sodium: 137 mmol/L (ref 135–145)
Total Bilirubin: 1 mg/dL (ref 0.3–1.2)
Total Protein: 6.9 g/dL (ref 6.5–8.1)

## 2020-04-25 LAB — HEPATITIS PANEL, ACUTE
HCV Ab: NONREACTIVE
Hep A IgM: NONREACTIVE
Hep B C IgM: NONREACTIVE
Hepatitis B Surface Ag: NONREACTIVE

## 2020-04-25 LAB — MAGNESIUM: Magnesium: 2.3 mg/dL (ref 1.7–2.4)

## 2020-04-25 MED ORDER — SORBITOL 70 % SOLN
30.0000 mL | Freq: Once | Status: AC
  Administered 2020-04-25: 30 mL via ORAL
  Filled 2020-04-25: qty 30

## 2020-04-25 MED ORDER — DEXAMETHASONE 4 MG PO TABS
4.0000 mg | ORAL_TABLET | Freq: Three times a day (TID) | ORAL | Status: DC
  Administered 2020-04-26 (×2): 4 mg via ORAL
  Filled 2020-04-25 (×2): qty 1

## 2020-04-25 MED ORDER — DEXAMETHASONE SODIUM PHOSPHATE 10 MG/ML IJ SOLN
10.0000 mg | Freq: Once | INTRAMUSCULAR | Status: AC
  Administered 2020-04-25: 10 mg via INTRAVENOUS
  Filled 2020-04-25: qty 1

## 2020-04-25 MED ORDER — VANCOMYCIN HCL 1500 MG/300ML IV SOLN
1500.0000 mg | Freq: Two times a day (BID) | INTRAVENOUS | Status: DC
  Administered 2020-04-25 – 2020-04-26 (×2): 1500 mg via INTRAVENOUS
  Filled 2020-04-25 (×2): qty 300

## 2020-04-25 MED ORDER — VANCOMYCIN HCL 750 MG/150ML IV SOLN
750.0000 mg | INTRAVENOUS | Status: AC
  Administered 2020-04-25: 750 mg via INTRAVENOUS
  Filled 2020-04-25: qty 150

## 2020-04-25 MED ORDER — LIDOCAINE HCL (PF) 1 % IJ SOLN
INTRAMUSCULAR | Status: AC
  Filled 2020-04-25: qty 30

## 2020-04-25 MED ORDER — ENOXAPARIN SODIUM 40 MG/0.4ML ~~LOC~~ SOLN
40.0000 mg | SUBCUTANEOUS | Status: DC
  Filled 2020-04-25: qty 0.4

## 2020-04-25 MED ORDER — LIDOCAINE HCL (PF) 1 % IJ SOLN
10.0000 mL | Freq: Once | INTRAMUSCULAR | Status: DC
  Filled 2020-04-25: qty 10

## 2020-04-25 NOTE — Progress Notes (Addendum)
Pharmacy Antibiotic Note  Andrew Roberts is a 50 y.o. male presented to to [facility] from [place] ED on 04/23/2020 with c/o scrotal pain and swelling.  He's currently on vancomycin and unasyn for infection.  Today, 04/25/2020: - day #2 abx - afeb, wbc elevated but down - scr down 0.93 (crcl~100) - updated wt = 98 kg, ht= 73 inches - plan for scrotal US to r/o abscess  Plan: - vancomycin 750mg  IV x1 now in addition to the 750mg  dose given this morning to get a total of 1500mg  for this morning, then vancomycin to 1500 mg IV q12h for est AUC 464 - continue unasyn 3gm IV q6h - monitor renal function and clinical status __________________________________  Height: 6\' 1"  (185.4 cm) Weight: 97.8 kg (215 lb 8 oz) IBW/kg (Calculated) : 79.9  Temp (24hrs), Avg:98.2 F (36.8 C), Min:97.5 F (36.4 C), Max:98.7 F (37.1 C)  Recent Labs  Lab 04/24/20 0216 04/25/20 0524  WBC 19.4* 16.9*  CREATININE 1.04 0.93  LATICACIDVEN 1.2  --     Estimated Creatinine Clearance: 114.5 mL/min (by C-G formula based on SCr of 0.93 mg/dL).    Allergies  Allergen Reactions  . Aspirin     Other reaction(s): Other Ulcers  ulcers Ulcers     Antimicrobials this admission: 4/1 Invanz + Clinda x1 at [facility] ED  4/1 Vancomycin >>  4/1 unasyn>>     Microbiology results: 4/2 bcx x2:  4/3 hepatitis panel:    Thank you for allowing pharmacy to be a part of this patient's care.  6/1 04/25/2020 9:40 AM

## 2020-04-25 NOTE — Procedures (Signed)
Procedure: Incisions and drainage of complex scrotal wall abscess CPT: 10061  Indication: Patient admitted for scrotal cellulitis, initially with no drainable fluid collection of imaging, who had a new area of fluctuance at right anterior superior hemiscrotum today. Subsequent scrotal ultrasound with 7.1 x 2.9 x 6.7 cm irregular fluid collection in the deep soft tissues of the anterior superior right scrotum c/w abscess.  Procedure:  Patient was seen in his hospital room. Procedure was described in detail, including risks/benefits. Verbal consent was obtained from the patient.  His right hemiscrotum was prepped with alcohol. I injected 35cc of 1% lidocaine into area of fluctuance at right anterior superior hemiscrotum. Using a 15 blade scalpel, I made a ~3cm longitudinal incision in the area. I probed the area with a Q-tip and was able to express purulent discharge. I swabbed the wound with a culture swab. I then probed the wound with my finger, breaking apart some wound loculations and expressing about ~15cc of pus. The wound tracked ~4cm inferiorly, ~2cm medially, ~1cm laterally, and ~1cm superiorly to the incision. Once I had broken apart all loculations and expressed as much pus as possible, I copiously irrigated the wound with normal saline. I then packed the wound with kerlix moistened with normal saline. This completed the procedure  Recommendations: - Agree with broadening antibiotics to vancomycin and unasyn - Aerobic wound culture sent. Follow up culture results and narrow/adjust antibiotics accordingly - I will examine wound and change packing tomorrow. The kerlix was slightly too large for the wound, so I will plan on repacking with 1/2 iodoform gauze - Patient can likely still discharge tomorrow if he and his wife are comfortable with wound care - Recommend Bactrim DS BID or Augmentin DS BID x 14 weeks for empiric coverage pending culture results

## 2020-04-25 NOTE — Progress Notes (Signed)
Subjective: AFHDS. Improving leukocytosis. Reports slight improvement in tightness and tenderness of scrotum. Reports small amount of drainage from pinpoint right hemiscrotal wound   Objective: Vital signs in last 24 hours: Temp:  [97.5 F (36.4 C)-98.7 F (37.1 C)] 97.5 F (36.4 C) (04/03 0459) Pulse Rate:  [66-103] 66 (04/03 0459) Resp:  [15-19] 19 (04/03 0459) BP: (140-152)/(85-102) 152/98 (04/03 0459) SpO2:  [99 %-100 %] 99 % (04/03 0459) Weight:  [97.8 kg] 97.8 kg (04/02 1301)  Intake/Output from previous day: 04/02 0701 - 04/03 0700 In: 2631.7 [P.O.:595; I.V.:1330.6; IV Piggyback:706.1] Out: 500 [Urine:500] Intake/Output this shift: No intake/output data recorded.  Physical Exam:  Constitutional:  Alert and oriented, laying in bed in no acute distress Cardiovascular: Regular rate  Respiratory: Normal respiratory effort on RA GU: Normal circumcised phallus with normal urethral meatus. Erythematous rash over suprapubic region extended over bilateral groins and scrotum to perineum; mild improvement in erythema. Softer, less tender right hemiscrotum with new area of fluctuance at superior aspect, improved swelling, not able to clearly palpate testicle. No drainage of pus from superior right hemiscrotal pinpoint wound. Right groin swelling and tenderness. Soft left hemiscrotum that is less tender compared to right, able to palpate testicle without associated tenderness. No crepitus  Neurologic: Grossly intact, no focal deficits Psychiatric: Normal mood and affect  Lab Results: Recent Labs    04/24/20 0216 04/25/20 0524  HGB 12.5* 13.0  HCT 37.5* 40.4   BMET Recent Labs    04/24/20 0216 04/25/20 0524  NA 133* 137  K 4.2 4.5  CL 104 103  CO2 22 26  GLUCOSE 153* 93  BUN 15 13  CREATININE 1.04 0.93  CALCIUM 8.3* 8.8*     Studies/Results: MR Lumbar Spine W Wo Contrast  Result Date: 04/24/2020 CLINICAL DATA:  Chronic low back pain radiating into the hips,  laterality not specified. No known injury. EXAM: MRI LUMBAR SPINE WITHOUT AND WITH CONTRAST TECHNIQUE: Multiplanar and multiecho pulse sequences of the lumbar spine were obtained without and with intravenous contrast. CONTRAST:  52mL GADAVIST GADOBUTROL 1 MMOL/ML IV SOLN COMPARISON:  Lumbar spine radiographs 04/09/2020. Abdominopelvic CT 04/23/2020. FINDINGS: Segmentation: Conventional anatomy assumed, with the last open disc space designated L5-S1.Concordant with previous imaging. Alignment:  Physiologic. Vertebrae: No worrisome osseous lesion, acute fracture or pars defect. The visualized sacroiliac joints appear unremarkable. Conus medullaris: Extends to the L1-2 level and appears normal. No abnormal intradural enhancement. Paraspinal and other soft tissues: No significant paraspinal findings. Disc levels: From T12-L1 through L2-3, the discs are hydrated with maintained height. No spinal stenosis or nerve root encroachment. L3-4: Mild annular disc bulging and mild loss of disc height. Mild bilateral facet hypertrophy. There is mild narrowing of the foramina bilaterally without nerve root encroachment. L4-5: Mild annular disc bulging with a small left foraminal annular rent. Mild asymmetric narrowing of the left lateral recess and left foramen without definite nerve root encroachment. L5-S1: Mild disc bulging with a moderate-sized extraforaminal disc extrusion on the right. This likely causes right extraforaminal L5 nerve root encroachment. The left foramen and lateral recesses are patent. IMPRESSION: 1. Moderate size extraforaminal disc extrusion on the right at L5-S1 with probable resulting extraforaminal right L5 nerve root encroachment. Correlate with specific radicular symptoms. 2. Mild disc bulging and small left foraminal annular rent at L4-5, without nerve root encroachment. 3. Mild disc bulging contributing to mild foraminal narrowing bilaterally at L3-4. 4. No acute osseous findings. Electronically Signed    By: Hilarie Fredrickson.D.  On: 04/24/2020 15:00   US Abdomen Limited RUQ (LIVER/GB)  Result Date: 04/24/2020 CLINICAL DATA:  Elevated LFTs. EXAM: ULTRASOUND ABDOMEN LIMITED RIGHT UPPER QUADRANT COMPARISON:  CT 04/23/2020 FINDINGS: Gallbladder: No gallstones or wall thickening visualized. No sonographic Murphy sign noted by sonographer. Common bile duct: Diameter: 3 mm. Liver: Parenchymal echogenicity within normal. 1.7 cm cyst over the left lobe. Portal vein is patent on color Doppler imaging with normal direction of blood flow towards the liver. Other: None. IMPRESSION: 1.  No acute hepatobiliary findings.  No cholelithiasis. 2.  1.7 cm liver cyst. Electronically Signed   By: Elberta Fortis M.D.   On: 04/24/2020 10:05    Assessment/Plan: Andrew Roberts is a 50 y.o. male with history of HTN, back pain, and GERD who was admitted on 04/23/20 for worsening swelling, erythema, and pain in his scrotum with imaging and physical exam consistent with scrotal cellulitis. No drainable fluid collection on imaging from 04/23/20. No evidence of fournier's gangrene on imaging or physical exam.   Patient appears to be improving clinically on empiric vancomycin with downtrending leukocytosis and improving scrotal pain. AFHDS. However, there is a new area of fluctuance at the superior right hemiscrotal aspect, so we recommend repeat scrotal imaging today to rule out new abscess formation.   - Repeat scrotal ultrasound today - Agree with empiric treatment of cellulitis with vancomycin - Follow up cultures and narrow/adjust antibiotics accordingly - NSAIDs, scrotal support, and ice versus heat PRN for pain  - We will continue to follow along      LOS: 2 days   Margette Fast 04/25/2020, 9:26 AM

## 2020-04-25 NOTE — Progress Notes (Signed)
Called in regards to this patient's MRI lumbar spine. Apparently he was admitted for scrotal cellulitis and was having some radiculopathy in his right leg with no focal weakness. MRI shows a far extraforaminal disc protrusion on the right at L5-S1. This does not need any type of acute intervention. Manage with steroids and pain medications and follow up as outpatient.

## 2020-04-25 NOTE — Progress Notes (Signed)
PROGRESS NOTE    Andrew Roberts  JYN:[id number] DOB: 08-29-1970 DOA: 04/23/2020 PCP: No primary care provider on file.    No chief complaint on file.   Brief Narrative: HPI per Dr. Marlowe Sax Lundy Cozart is a 50 y.o. male with medical history significant of hypertension, GERD presented to [facility] ED today with complaints of worsening swelling, erythema, and pain of his scrotum.  He was seen by his PCP several days ago for suspected simple abscess/cellulitis and placed on antibiotics but the infection continued to worsen.   At [facility], afebrile but tachycardic.  Not hypotensive.  Labs showing WBC 25.0, hemoglobin 14.0, platelet count 179K.  Sodium 133, potassium 4.8, chloride 99, bicarb 29, BUN 16, creatinine 1.0, glucose 115.  Lactic acid 1.9.  AST 99, ALT 216, alk phos 320, T bili 1.5.  UA with negative nitrite, negative leukocytes, 2 WBCs, and few bacteria.  Screening Covid and influenza test negative.   CT abdomen pelvis with contrast done at [facility] showing findings compatible with acute scrotal cellulitis with marked edema and skin thickening.  Associated reactive hydrocele.  Left hemiscrotal ill-defined fluid with enhancement, may represent reactive hydrocele or phlegmon versus early abscess.  Ultrasound recommended for further evaluation.  Scrotal ultrasound was done and revealed marked changes of right than left scrotal skin thickening with complex mixed echogenicity edema/ill-defined tracking scrotal wall fluid compatible with cellulitis and phlegmon reaction.  No defined fluid collection.  No testicular abnormality or evidence of torsion.  Small reactive right hydrocele.  Patient was given 1 L normal saline bolus, hydromorphone, vancomycin, morphine, and Zofran.  ED physician at [facility] discussed the case with on-call urologist Dr. Charlesetta Shanks at [facility] who recommended continuing IV antibiotic but if any signs of decompensation then reimage and  reconsult urology.  Patient states about 10 days ago he noticed an ingrown hair on his scrotum which he tried to remove on his own and clean the area with hydrogen peroxide.  The following day he noticed a small bump in this area.  Since then he has continued to have worsening erythema, pain, and swelling.  States last week he was seen at a clinic and prescribed Bactrim but it did not help and his infection has continued to worsen.  Denies dysuria.  He had chills yesterday but did not check his temperature. No nausea or vomiting.  He has received 1 dose of Moderna Covid vaccine.  Denies cough or shortness of breath.  He reports history of lumbar radiculopathy for which he is seen by orthopedics and receiving steroid injections.  Assessment & Plan:   Principal Problem:   Sepsis ([place]) Active Problems:   Cellulitis, scrotum   Elevated liver enzymes   Alcohol use   Essential hypertension   Lumbar radiculopathy   Leukocytosis  1 sepsis secondary to acute scrotal cellulitis with phlegmon -On admission patient met criteria for sepsis with tachycardia, leukocytosis, scrotal cellulitis. -Scrotum noted to be edematous, erythematous extending to the perineal region and inner thigh on presentation with some exquisite right inguinal tenderness to palpation and induration. -Urinalysis done not suggestive of infection. -Scrotal ultrasound done at [facility] showed marked changes of right and left scrotal skin thickening with complex mixed echogenicity edema/ill-defined tracking scrotal wall fluid compatible with cellulitis and phlegmon reaction, no defined fluid collection. -CT done at [facility] with no findings to suggest Fournier's gangrene. -Patient with some exquisite tenderness and induration in the GU region which is slowly improving. -Patient seen in consultation by urology,  new area of fluctuance noted and as such scrotal ultrasound done this morning to rule out abscess formation  which may need I and D. -Leukocytosis trending down.  Afebrile. -Continue IV vancomycin, IV Unasyn. -Continue gentle hydration and saline lock IV fluids in the next 24 hours. -Continue current pain management..  2.  Lumbar radiculopathy -Patient and wife seem frustrated by work-up in the outpatient setting by orthopedics for lumbar radiculopathy. -Family MRI was to be obtained however has been taking a while. -Has been noted that patient has received some steroid injections. -Patient still with significant pain which is described as radiating from right buttocks region down his lower extremity to his anterior shin and dorsal aspect of foot. -Patient denies any bowel or urinary incontinence. -Patient with no significant lower extremity weakness. -MRI of L-spine (04/24/2020) with moderate size extraforaminal disc extrusion on the right L5-S1 with probable resulting extraforaminal right L5 nerve root with encroachment.  Mild disc bulging and small left foraminal annular rent at L4-5 without nerve root encroachment.  Mild disc bulging contributing to mild foraminal narrowing bilaterally at L3-4.  -Case discussed with neurosurgery Glenford Peers, NP) who does not feel a formal consultation is needed at this time and recommending steroids and pain management with outpatient follow-up.  -Continue current regimen of Ultram 3 times daily, IV Toradol as needed, gabapentin, Zanaflex.  -We will give Decadron 10 mg IV x1 today and then Decadron 4 mg p.o. every 8 hours for 24 hours and subsequently placed on a Medrol Dosepak per neurosurgery recommendations.  -We will need outpatient follow-up with neurosurgery.  3.  Hypertension -Losartan.  4.  Transaminitis -Patient noted to have daily alcohol use. -Right upper quadrant ultrasound obtained with no acute hepatobiliary findings.  No cholelithiasis.  1.7 cm liver cyst. -LFTs trending down. -Acute hepatitis panel pending.  -Repeat labs in the  morning.  5.  Leukocytosis Likely secondary to problem #1. -Blood cultures ordered and pending.  Urine cultures pending. -Leukocytosis trending down.   -Continue IV vancomycin, IV Unasyn.  -Follow.  6.  Chronic alcohol use -Patient with no signs of withdrawal.   -Continue Ativan withdrawal protocol, thiamine, folic acid, multivitamin.   -Supportive care.     DVT prophylaxis: Heparin >>>> Lovenox  Code Status: Full Family Communication: Updated patient.  No family at bedside.   Disposition:   Status is: Inpatient    Dispo: The patient is from: Home              Anticipated d/c is to: Home              Patient currently with scrotal cellulitis, some induration in the scrotal region, on IV antibiotics, repeat scrotal ultrasound pending urology consultation pending.   Difficult to place patient No       Consultants:   Urology: Dr. Sheppard Coil 04/24/2020  Procedures:   MRI L-spine: 04/24/2020  Scrotal ultrasound pending 04/25/2020  Antimicrobials:   IV vancomycin 04/24/2020>>>>  IV Unasyn 04/24/2020>>>>   Subjective: Standing up in room.  Denies chest pain or shortness of breath.  No abdominal pain.  Feels right lower extremity pain with burning and tingling with some improvement on current pain regimen.  Denies any right lower extremity weakness.  Denies any bowel or urinary incontinence.  Complain of constipation. Feels scrotal swelling, erythema and edema slowly improving. Scrotal ultrasound just completed this morning  Objective: Vitals:   04/24/20 1438 04/24/20 1800 04/24/20 2055 04/25/20 0459  BP: (!) 149/102 140/88 (!) 143/85 (!) 152/98  Pulse: (!) 103 99 72 66  Resp: 15  18 19   Temp: 98.7 F (37.1 C)  98.2 F (36.8 C) (!) 97.5 F (36.4 C)  TempSrc: Oral  Oral Oral  SpO2: 100%  100% 99%  Weight:      Height:        Intake/Output Summary (Last 24 hours) at 04/25/2020 1114 Last data filed at 04/25/2020 0631 Gross per 24 hour  Intake 2396.66 ml  Output 500 ml   Net 1896.66 ml   Filed Weights   04/24/20 1301  Weight: 97.8 kg    Examination:  General exam: NAD Respiratory system: CTA B.  No wheezes, no crackles, no rhonchi.  Normal respiratory effort.  Cardiovascular system: Regular rate rhythm no murmurs rubs or gallops.  No JVD.  No lower extremity edema. Gastrointestinal system: Abdomen is soft, nontender, nondistended, positive bowel sounds.  No rebound.  No guarding. Central nervous system: Alert and oriented. No focal neurological deficits. Genitourinary: Scrotum with decreased erythema, some decreased swelling and tightness right greater than left standing into perineal region and inner thigh, right inguinal region exquisitely tender to palpation with decreased induration.  Right hemiscrotal with some fluctuance in the superior aspect. Extremities: Symmetric 5 x 5 power. Skin: No rashes, lesions or ulcers Psychiatry: Judgement and insight appear normal. Mood & affect appropriate.     Data Reviewed: I have personally reviewed following labs and imaging studies  CBC: Recent Labs  Lab 04/24/20 0216 04/25/20 0524  WBC 19.4* 16.9*  NEUTROABS  --  14.6*  HGB 12.5* 13.0  HCT 37.5* 40.4  MCV 95.7 98.1  PLT 170 811    Basic Metabolic Panel: Recent Labs  Lab 04/24/20 0216 04/25/20 0524  NA 133* 137  K 4.2 4.5  CL 104 103  CO2 22 26  GLUCOSE 153* 93  BUN 15 13  CREATININE 1.04 0.93  CALCIUM 8.3* 8.8*  MG 2.0 2.3  PHOS 2.6  --     GFR: Estimated Creatinine Clearance: 114.5 mL/min (by C-G formula based on SCr of 0.93 mg/dL).  Liver Function Tests: Recent Labs  Lab 04/24/20 0216 04/25/20 0524  AST 52* 30  ALT 144* 106*  ALKPHOS 190* 255*  BILITOT 1.0 1.0  PROT 6.1* 6.9  ALBUMIN 2.8* 2.8*    CBG: No results for input(s): GLUCAP in the last 168 hours.   No results found for this or any previous visit (from the past 240 hour(s)).       Radiology Studies: MR Lumbar Spine W Wo Contrast  Result Date:  04/24/2020 CLINICAL DATA:  Chronic low back pain radiating into the hips, laterality not specified. No known injury. EXAM: MRI LUMBAR SPINE WITHOUT AND WITH CONTRAST TECHNIQUE: Multiplanar and multiecho pulse sequences of the lumbar spine were obtained without and with intravenous contrast. CONTRAST:  64m GADAVIST GADOBUTROL 1 MMOL/ML IV SOLN COMPARISON:  Lumbar spine radiographs 04/09/2020. Abdominopelvic CT 04/23/2020. FINDINGS: Segmentation: Conventional anatomy assumed, with the last open disc space designated L5-S1.Concordant with previous imaging. Alignment:  Physiologic. Vertebrae: No worrisome osseous lesion, acute fracture or pars defect. The visualized sacroiliac joints appear unremarkable. Conus medullaris: Extends to the L1-2 level and appears normal. No abnormal intradural enhancement. Paraspinal and other soft tissues: No significant paraspinal findings. Disc levels: From T12-L1 through L2-3, the discs are hydrated with maintained height. No spinal stenosis or nerve root encroachment. L3-4: Mild annular disc bulging and mild loss of disc height. Mild bilateral facet hypertrophy. There is mild narrowing of the foramina bilaterally  without nerve root encroachment. L4-5: Mild annular disc bulging with a small left foraminal annular rent. Mild asymmetric narrowing of the left lateral recess and left foramen without definite nerve root encroachment. L5-S1: Mild disc bulging with a moderate-sized extraforaminal disc extrusion on the right. This likely causes right extraforaminal L5 nerve root encroachment. The left foramen and lateral recesses are patent. IMPRESSION: 1. Moderate size extraforaminal disc extrusion on the right at L5-S1 with probable resulting extraforaminal right L5 nerve root encroachment. Correlate with specific radicular symptoms. 2. Mild disc bulging and small left foraminal annular rent at L4-5, without nerve root encroachment. 3. Mild disc bulging contributing to mild foraminal narrowing  bilaterally at L3-4. 4. No acute osseous findings. Electronically Signed   By: Richardean Sale M.D.   On: 04/24/2020 15:00   US Abdomen Limited RUQ (LIVER/GB)  Result Date: 04/24/2020 CLINICAL DATA:  Elevated LFTs. EXAM: ULTRASOUND ABDOMEN LIMITED RIGHT UPPER QUADRANT COMPARISON:  CT 04/23/2020 FINDINGS: Gallbladder: No gallstones or wall thickening visualized. No sonographic Murphy sign noted by sonographer. Common bile duct: Diameter: 3 mm. Liver: Parenchymal echogenicity within normal. 1.7 cm cyst over the left lobe. Portal vein is patent on color Doppler imaging with normal direction of blood flow towards the liver. Other: None. IMPRESSION: 1.  No acute hepatobiliary findings.  No cholelithiasis. 2.  1.7 cm liver cyst. Electronically Signed   By: Marin Olp M.D.   On: 04/24/2020 10:05        Scheduled Meds: . folic acid  1 mg Oral Daily  . gabapentin  600 mg Oral BID  . heparin  5,000 Units Subcutaneous Q8H  . LORazepam  0-4 mg Intravenous Q6H   Followed by  . [START ON 04/26/2020] LORazepam  0-4 mg Intravenous Q12H  . losartan  100 mg Oral Daily  . multivitamin with minerals  1 tablet Oral Daily  . multivitamin with minerals  1 tablet Oral Daily  . pantoprazole  40 mg Oral Q0600  . polyethylene glycol  17 g Oral Daily  . senna-docusate  1 tablet Oral BID  . thiamine  100 mg Oral Daily   Or  . thiamine  100 mg Intravenous Daily  . thiamine  100 mg Oral Daily   Or  . thiamine  100 mg Intravenous Daily  . traMADol  50 mg Oral TID   Continuous Infusions: . sodium chloride 125 mL/hr at 04/25/20 0200  . ampicillin-sulbactam (UNASYN) IV 3 g (04/25/20 0629)  . vancomycin    . vancomycin 750 mg (04/25/20 1039)     LOS: 2 days    Time spent: 40 minutes    Irine Seal, MD [facility]   To contact the attending provider between 7A-7P or the covering provider during after hours 7P-7A, please log into the web site [email or web address] and access using universal [facility] password for that web site. If you do not have the password, please call the hospital operator.  04/25/2020, 11:14 AM

## 2020-04-26 DIAGNOSIS — N5089 Other specified disorders of the male genital organs: Secondary | ICD-10-CM

## 2020-04-26 DIAGNOSIS — R748 Abnormal levels of other serum enzymes: Secondary | ICD-10-CM

## 2020-04-26 DIAGNOSIS — N492 Inflammatory disorders of scrotum: Secondary | ICD-10-CM | POA: Diagnosis present

## 2020-04-26 LAB — CBC WITH DIFFERENTIAL/PLATELET
Abs Immature Granulocytes: 0.21 10*3/uL — ABNORMAL HIGH (ref 0.00–0.07)
Basophils Absolute: 0 10*3/uL (ref 0.0–0.1)
Basophils Relative: 0 %
Eosinophils Absolute: 0 10*3/uL (ref 0.0–0.5)
Eosinophils Relative: 0 %
HCT: 41.6 % (ref 39.0–52.0)
Hemoglobin: 13.5 g/dL (ref 13.0–17.0)
Immature Granulocytes: 2 %
Lymphocytes Relative: 9 %
Lymphs Abs: 1.1 10*3/uL (ref 0.7–4.0)
MCH: 31.3 pg (ref 26.0–34.0)
MCHC: 32.5 g/dL (ref 30.0–36.0)
MCV: 96.5 fL (ref 80.0–100.0)
Monocytes Absolute: 0.5 10*3/uL (ref 0.1–1.0)
Monocytes Relative: 5 %
Neutro Abs: 9.7 10*3/uL — ABNORMAL HIGH (ref 1.7–7.7)
Neutrophils Relative %: 84 %
Platelets: 228 10*3/uL (ref 150–400)
RBC: 4.31 MIL/uL (ref 4.22–5.81)
RDW: 13.8 % (ref 11.5–15.5)
WBC: 11.6 10*3/uL — ABNORMAL HIGH (ref 4.0–10.5)
nRBC: 0 % (ref 0.0–0.2)

## 2020-04-26 LAB — COMPREHENSIVE METABOLIC PANEL
ALT: 75 U/L — ABNORMAL HIGH (ref 0–44)
AST: 17 U/L (ref 15–41)
Albumin: 2.9 g/dL — ABNORMAL LOW (ref 3.5–5.0)
Alkaline Phosphatase: 220 U/L — ABNORMAL HIGH (ref 38–126)
Anion gap: 8 (ref 5–15)
BUN: 18 mg/dL (ref 6–20)
CO2: 25 mmol/L (ref 22–32)
Calcium: 9.2 mg/dL (ref 8.9–10.3)
Chloride: 104 mmol/L (ref 98–111)
Creatinine, Ser: 0.85 mg/dL (ref 0.61–1.24)
GFR, Estimated: >60 mL/min (ref >60)
Glucose, Bld: 128 mg/dL — ABNORMAL HIGH (ref 70–99)
Potassium: 5.2 mmol/L — ABNORMAL HIGH (ref 3.5–5.1)
Sodium: 137 mmol/L (ref 135–145)
Total Bilirubin: 0.8 mg/dL (ref 0.3–1.2)
Total Protein: 7.1 g/dL (ref 6.5–8.1)

## 2020-04-26 LAB — MAGNESIUM: Magnesium: 2.3 mg/dL (ref 1.7–2.4)

## 2020-04-26 MED ORDER — FOLIC ACID 1 MG PO TABS: 1.0000 mg | ORAL_TABLET | Freq: Every day | ORAL | Status: AC

## 2020-04-26 MED ORDER — METHYLPREDNISOLONE 4 MG PO TBPK: ORAL_TABLET | ORAL | 0 refills | Status: AC

## 2020-04-26 MED ORDER — THIAMINE HCL 100 MG PO TABS: 100.0000 mg | ORAL_TABLET | Freq: Every day | ORAL | Status: AC

## 2020-04-26 MED ORDER — ADULT MULTIVITAMIN W/MINERALS CH: 1.0000 | ORAL_TABLET | Freq: Every day | ORAL | Status: AC

## 2020-04-26 MED ORDER — TRAMADOL HCL 50 MG PO TABS: 50.0000 mg | ORAL_TABLET | Freq: Four times a day (QID) | ORAL | Status: DC | PRN

## 2020-04-26 MED ORDER — POLYETHYLENE GLYCOL 3350 17 G PO PACK: 17.0000 g | PACK | Freq: Every day | ORAL | 0 refills | Status: AC

## 2020-04-26 MED ORDER — PANTOPRAZOLE SODIUM 40 MG PO TBEC: 40.0000 mg | DELAYED_RELEASE_TABLET | Freq: Every day | ORAL | 0 refills | Status: AC

## 2020-04-26 MED ORDER — SULFAMETHOXAZOLE-TRIMETHOPRIM 800-160 MG PO TABS: 1.0000 | ORAL_TABLET | Freq: Two times a day (BID) | ORAL | 0 refills | Status: AC

## 2020-04-26 MED ORDER — SENNOSIDES-DOCUSATE SODIUM 8.6-50 MG PO TABS
1.0000 | ORAL_TABLET | Freq: Every day | ORAL | Status: AC
Start: 2020-04-26 — End: ?

## 2020-04-26 MED ORDER — TRAMADOL HCL 50 MG PO TABS: 50.0000 mg | ORAL_TABLET | Freq: Four times a day (QID) | ORAL | 0 refills | Status: AC | PRN

## 2020-04-26 NOTE — Discharge Summary (Signed)
Physician Discharge Summary  Andrew Roberts HOZ:[id number] DOB: Feb 11, 1970 DOA: 04/23/2020  PCP: No primary care provider on file.  Admit date: 04/23/2020 Discharge date: 04/26/2020  Time spent: 55 minutes  Recommendations for Outpatient Follow-up:  1. [facility], nurse practitioner by the end of the week.  On follow-up scrotal wall abscess cultures will need to be followed up on for finalization of culture results. 2. Follow-up with Dr. Sherley Bounds, neurosurgery in 2 to 3 weeks. 3. Follow-up with PCP in 2 to 3 weeks.  On follow-up patient will need a comprehensive metabolic profile done to follow-up on electrolytes, LFTs and renal function.  Patient needs a CBC done to follow-up on leukocytosis, H&H.   Discharge Diagnoses:  Principal Problem:   Sepsis ([place]) Active Problems:   Scrotal abscess   Cellulitis, scrotum   Elevated liver enzymes   Alcohol use   Essential hypertension   Lumbar radiculopathy   Leukocytosis   Liver enzyme elevation   Scrotal swelling   Discharge Condition: Stable and improved  Diet recommendation: Regular  Filed Weights   04/24/20 1301  Weight: 97.8 kg    History of present illness:  HPI per Dr. Marlowe Sax Andrew Roberts is a 50 y.o. male with medical history significant of hypertension, GERD presented to [facility] ED today with complaints of worsening swelling, erythema, and pain of his scrotum.  He was seen by his PCP several days ago for suspected simple abscess/cellulitis and placed on antibiotics but the infection continued to worsen.   At [facility], afebrile but tachycardic.  Not hypotensive.  Labs showing WBC 25.0, hemoglobin 14.0, platelet count 179K.  Sodium 133, potassium 4.8, chloride 99, bicarb 29, BUN 16, creatinine 1.0, glucose 115.  Lactic acid 1.9.  AST 99, ALT 216, alk phos 320, T bili 1.5.  UA with negative nitrite, negative leukocytes, 2 WBCs, and few bacteria.  Screening Covid and influenza test  negative.   CT abdomen pelvis with contrast done at [facility] showing findings compatible with acute scrotal cellulitis with marked edema and skin thickening.  Associated reactive hydrocele.  Left hemiscrotal ill-defined fluid with enhancement, may represent reactive hydrocele or phlegmon versus early abscess.  Ultrasound recommended for further evaluation.  Scrotal ultrasound was done and revealed marked changes of right than left scrotal skin thickening with complex mixed echogenicity edema/ill-defined tracking scrotal wall fluid compatible with cellulitis and phlegmon reaction.  No defined fluid collection.  No testicular abnormality or evidence of torsion.  Small reactive right hydrocele.  Patient was given 1 L normal saline bolus, hydromorphone, vancomycin, morphine, and Zofran.  ED physician at [facility] discussed the case with on-call urologist Dr. Charlesetta Shanks at [facility] who recommended continuing IV antibiotic but if any signs of decompensation then reimage and reconsult urology.  Patient stated about 10 days ago he noticed an ingrown hair on his scrotum which he tried to remove on his own and clean the area with hydrogen peroxide.  The following day he noticed a small bump in this area.  Since then he has continued to have worsening erythema, pain, and swelling.  Stated last week he was seen at a clinic and prescribed Bactrim but it did not help and his infection has continued to worsen.  Denied dysuria.  He had chills 1 day prior to admission, but did not check his temperature. No nausea or vomiting.  He has received 1 dose of Moderna Covid vaccine.  Denied cough or shortness of breath.  He reported history of lumbar  radiculopathy for which he was seen by orthopedics and receiving steroid injections.  Hospital Course:  1 sepsis secondary to acute scrotal cellulitis with phlegmon and scrotal wall abscess status post I and D. -On admission patient met criteria for sepsis with  tachycardia, leukocytosis, scrotal cellulitis. -Scrotum noted to be edematous, erythematous extending to the perineal region and inner thigh on presentation with some exquisite right inguinal tenderness to palpation and induration. -Urinalysis done not suggestive of infection. -Scrotal ultrasound done at [facility] showed marked changes of right and left scrotal skin thickening with complex mixed echogenicity edema/ill-defined tracking scrotal wall fluid compatible with cellulitis and phlegmon reaction, no defined fluid collection. -CT done at [facility] with no findings to suggest Fournier's gangrene. -Patient with some exquisite tenderness and induration in the GU region which is slowly improving. -Patient seen in consultation by urology, new area of fluctuance noted and as such scrotal ultrasound done which was concerning for complex scrotal wall abscess of 7.1 x 2.9 x 6.7 cm in the deep soft tissues of the anterior superior right scrotum.   -Patient was placed on IV vancomycin IV Unasyn throughout the hospitalization.   -Patient subsequently underwent incision and drainage of complex scrotal wall abscess per urology, Dr. Sheppard Coil 04/25/2020, with 15 cc of pus noted which was sent for cultures with preliminary results positive for staph aureus.  -Patient improved clinically, leukocytosis trended down, patient remained afebrile. -Patient cleared by urology for discharge home on 8 more days of Bactrim DS twice daily to complete a 10-day course of antibiotic treatment.  -Close outpatient follow-up with urology in the next 3 days.  -Patient will be discharged in stable and improved condition.    2.  Lumbar radiculopathy -Patient and wife seemed frustrated by work-up in the outpatient setting by orthopedics for lumbar radiculopathy. -Per Family MRI was to be obtained however has been taking a while. -Has been noted that patient has received some steroid injections. -Patient still with  significant pain which he described as radiating from right buttocks region down his lower extremity to his anterior shin and dorsal aspect of foot. -Patient denied any bowel or urinary incontinence. -Patient with no significant lower extremity weakness. -MRI of L-spine (04/24/2020) with moderate size extraforaminal disc extrusion on the right L5-S1 with probable resulting extraforaminal right L5 nerve root with encroachment.  Mild disc bulging and small left foraminal annular rent at L4-5 without nerve root encroachment.  Mild disc bulging contributing to mild foraminal narrowing bilaterally at L3-4.  -Case discussed with neurosurgery Glenford Peers, NP) who did not feel a formal consultation was needed at this time and recommended steroids and pain management with outpatient follow-up.  -Patient initially placed on scheduled Ultram 3 times daily, IV Toradol as needed, IV morphine as needed, gabapentin and Zanaflex which he tolerated.   -Patient given a dose of Decadron 10 mg IV x1 and subsequently placed on Decadron 4 mg p.o. every 8 hours during the hospitalization.   -Patient improved clinically and patient be discharged home on a Medrol Dosepak per neurosurgical recommendations.   -Patient will also be discharged home on Ultram as needed  -Outpatient follow-up with neurosurgery in 2 to 3 weeks.   3.  Hypertension -Patient maintained on home regimen losartan.  4.  Transaminitis -Patient noted to have daily alcohol use. -Right upper quadrant ultrasound obtained with no acute hepatobiliary findings.  No cholelithiasis.  1.7 cm liver cyst. -LFTs trended down.   -Acute hepatitis panel which was done was negative.   -  Patient follow-up with PCP.    5.  Leukocytosis Likely secondary to problem #1. -Blood cultures ordered and pending with no growth to date. -Urine cultures were ordered and pending. -Patient underwent incision and drainage of scrotal wall abscess with preliminary cultures with  moderate staph aureus. -Patient was placed empirically on IV vancomycin IV Unasyn during the hospitalization with improvement with leukocytosis. -Patient be discharged home on 8 more days of oral Bactrim DS twice daily to complete a 10-day course of treatment. -Outpatient follow-up.  6.  Chronic alcohol use -Patient with no signs of withdrawal during the hospitalization.   -Patient placed on Ativan withdrawal protocol, thiamine, folic acid, multivitamin.  -Alcohol cessation stressed to patient.   -Outpatient follow-up.       Procedures:  MRI L-spine: 04/24/2020  Scrotal ultrasound 04/25/2020  Incision and drainage of complex scrotal wall abscess per Dr. Sheppard Coil 04/25/2020  Consultations:  Urology: Dr. McCloskey/Dr. Dahlstedt 04/24/2020  Curb sided neurosurgery: Glenford Peers, NP 04/25/2020    Discharge Exam: Vitals:   04/25/20 2026 04/26/20 0501  BP: (!) 159/94 138/85  Pulse: 84 67  Resp: 18 18  Temp: (!) 97.5 F (36.4 C) (!) 97.5 F (36.4 C)  SpO2: 100% 99%    General: NAD Cardiovascular: RRR Respiratory: CTAB Gu: Scrotal packing in place.  Discharge Instructions   Discharge Instructions    Diet general   Complete by: As directed    Increase activity slowly   Complete by: As directed      Allergies as of 04/26/2020      Reactions   Aspirin    Other reaction(s): Other Ulcers  ulcers Ulcers       Medication List    STOP taking these medications   methocarbamol 500 MG tablet Commonly known as: ROBAXIN     TAKE these medications   cholecalciferol 25 MCG (1000 UNIT) tablet Commonly known as: VITAMIN D3 Take 2,000 Units by mouth daily.   folic acid 1 MG tablet Commonly known as: FOLVITE Take 1 tablet (1 mg total) by mouth daily. Start taking on: April 27, 2020   gabapentin 600 MG tablet Commonly known as: NEURONTIN Take 600 mg by mouth 2 (two) times daily.   losartan 100 MG tablet Commonly known as: COZAAR Take 100 mg by mouth daily.    magnesium 30 MG tablet Take 30 mg by mouth daily.   methylPREDNISolone 4 MG Tbpk tablet Commonly known as: MEDROL DOSEPAK Per instructions   multivitamin with minerals Tabs tablet Take 1 tablet by mouth daily. Start taking on: April 27, 2020   pantoprazole 40 MG tablet Commonly known as: PROTONIX Take 1 tablet (40 mg total) by mouth daily at 6 (six) AM. Start taking on: April 27, 2020   polyethylene glycol 17 g packet Commonly known as: MIRALAX / GLYCOLAX Take 17 g by mouth daily. Start taking on: April 27, 2020   senna-docusate 8.6-50 MG tablet Commonly known as: Senokot-S Take 1 tablet by mouth at bedtime.   sulfamethoxazole-trimethoprim 800-160 MG tablet Commonly known as: BACTRIM DS Take 1 tablet by mouth 2 (two) times daily for 8 days.   thiamine 100 MG tablet Take 1 tablet (100 mg total) by mouth daily.   tiZANidine 4 MG tablet Commonly known as: ZANAFLEX Take 4-8 mg by mouth 3 (three) times daily.   traMADol 50 MG tablet Commonly known as: ULTRAM Take 1 tablet (50 mg total) by mouth every 6 (six) hours as needed for moderate pain.   zinc gluconate 50 MG  tablet Take 50 mg by mouth daily.      Allergies  Allergen Reactions  . Aspirin     Other reaction(s): Other Ulcers  ulcers Ulcers      Follow-up Information    Eustace Moore, MD. Schedule an appointment as soon as possible for a visit in 2 week(s).   Specialty: Neurosurgery Why: f/u in 2-3 weeks. Contact information: [address] [place] [address] [phone]        [facility] Follow up.   Why: call for appt to be seen by a nurse practitione by the end of the week Contact information: [place] [phone]       PCP. Schedule an appointment as soon as possible for a visit in 3 week(s).   Why: Follow-up in 2 to 3 weeks.       [facility] Follow up.   Why: For  substance abuse resources if needed. [phone]               The results of significant diagnostics from this hospitalization (including imaging, microbiology, ancillary and laboratory) are listed below for reference.    Significant Diagnostic Studies: MR Lumbar Spine W Wo Contrast  Result Date: 04/24/2020 CLINICAL DATA:  Chronic low back pain radiating into the hips, laterality not specified. No known injury. EXAM: MRI LUMBAR SPINE WITHOUT AND WITH CONTRAST TECHNIQUE: Multiplanar and multiecho pulse sequences of the lumbar spine were obtained without and with intravenous contrast. CONTRAST:  40m GADAVIST GADOBUTROL 1 MMOL/ML IV SOLN COMPARISON:  Lumbar spine radiographs 04/09/2020. Abdominopelvic CT 04/23/2020. FINDINGS: Segmentation: Conventional anatomy assumed, with the last open disc space designated L5-S1.Concordant with previous imaging. Alignment:  Physiologic. Vertebrae: No worrisome osseous lesion, acute fracture or pars defect. The visualized sacroiliac joints appear unremarkable. Conus medullaris: Extends to the L1-2 level and appears normal. No abnormal intradural enhancement. Paraspinal and other soft tissues: No significant paraspinal findings. Disc levels: From T12-L1 through L2-3, the discs are hydrated with maintained height. No spinal stenosis or nerve root encroachment. L3-4: Mild annular disc bulging and mild loss of disc height. Mild bilateral facet hypertrophy. There is mild narrowing of the foramina bilaterally without nerve root encroachment. L4-5: Mild annular disc bulging with a small left foraminal annular rent. Mild asymmetric narrowing of the left lateral recess and left foramen without definite nerve root encroachment. L5-S1: Mild disc bulging with a moderate-sized extraforaminal disc extrusion on the right. This likely causes right extraforaminal L5 nerve root encroachment. The left foramen and lateral recesses are patent. IMPRESSION: 1. Moderate size extraforaminal  disc extrusion on the right at L5-S1 with probable resulting extraforaminal right L5 nerve root encroachment. Correlate with specific radicular symptoms. 2. Mild disc bulging and small left foraminal annular rent at L4-5, without nerve root encroachment. 3. Mild disc bulging contributing to mild foraminal narrowing bilaterally at L3-4. 4. No acute osseous findings. Electronically Signed   By: WRichardean SaleM.D.   On: 04/24/2020 15:00   [place]  Result Date: 04/25/2020 CLINICAL DATA:  Inpatient.  Right scrotal swelling for 2 weeks. EXAM: ULTRASOUND OF SCROTUM TECHNIQUE: Complete ultrasound examination of the testicles, epididymis, and other scrotal structures was performed. COMPARISON:  None. FINDINGS: There is a complex 7.1 x 2.9 x 6.7 cm irregular fluid collection in the deep soft tissues of the anterior superior right scrotum with surrounding hyperemia on color Doppler, no internal vascularity and overlying skin  thickening, compatible with an abscess. This collection appears to involve the deep subcutaneous soft tissues and does not appear to involve the tunica vaginalis, testis or epididymis. Right testicle Measurements: 4.5 x 2.6 x 3.5 cm. No mass or microlithiasis visualized. Preserved vascularity on color Doppler in the right testis. Left testicle Measurements: 3.3 x 1.6 x 3.5 cm. No mass or microlithiasis visualized. Preserved vascularity on color Doppler in the left testis. Right epididymis:  Normal in size and appearance. Left epididymis:  Normal in size and appearance. Hydrocele:  No significant hydroceles. Varicocele:  None visualized. IMPRESSION: 1. Complex 7.1 x 2.9 x 6.7 cm irregular fluid collection in the deep subcutaneous soft tissues of the anterior superior right scrotum with surrounding hyperemia and overlying skin thickening, compatible with abscess. This collection does not appear to involve the tunica vaginalis, testis or epididymis. 2. Otherwise normal scrotal sonogram.  Normal  testes. Electronically Signed   By: Ilona Sorrel M.D.   On: 04/25/2020 12:41   US Abdomen Limited RUQ (LIVER/GB)  Result Date: 04/24/2020 CLINICAL DATA:  Elevated LFTs. EXAM: ULTRASOUND ABDOMEN LIMITED RIGHT UPPER QUADRANT COMPARISON:  CT 04/23/2020 FINDINGS: Gallbladder: No gallstones or wall thickening visualized. No sonographic Murphy sign noted by sonographer. Common bile duct: Diameter: 3 mm. Liver: Parenchymal echogenicity within normal. 1.7 cm cyst over the left lobe. Portal vein is patent on color Doppler imaging with normal direction of blood flow towards the liver. Other: None. IMPRESSION: 1.  No acute hepatobiliary findings.  No cholelithiasis. 2.  1.7 cm liver cyst. Electronically Signed   By: Marin Olp M.D.   On: 04/24/2020 10:05    Microbiology: Recent Results (from the past 240 hour(s))  Culture, blood (routine x 2)     Status: None (Preliminary result)   Collection Time: 04/24/20  2:16 AM   Specimen: BLOOD  Result Value Ref Range Status   Specimen Description   Final    BLOOD RIGHT ANTECUBITAL Performed at [facility] [address] [place], [place] [address]    Special Requests   Final    BOTTLES DRAWN AEROBIC ONLY Blood Culture results may not be optimal due to an excessive volume of blood received in culture bottles Performed at [facility] [address] [place], [place] [address]    Culture   Final    NO GROWTH 1 DAY Performed at [facility], [place] [address], [place], [place] [address]    Report Status PENDING  Incomplete  Culture, blood (routine x 2)     Status: None (Preliminary result)   Collection Time: 04/24/20  2:16 AM   Specimen: BLOOD RIGHT HAND  Result Value Ref Range Status   Specimen Description   Final    BLOOD RIGHT HAND Performed at [facility] [address], [place], [place] [address]    Special Requests   Final    BOTTLES DRAWN AEROBIC ONLY Blood Culture adequate  volume Performed at [facility] [address], [place], [place] [address]    Culture   Final    NO GROWTH 1 DAY Performed at [facility], [place] [address], [place], [place] [address]    Report Status PENDING  Incomplete  Aerobic Culture w Gram Stain (superficial specimen)     Status: None (Preliminary result)   Collection Time: 04/25/20  3:28 PM   Specimen: Scrotum  Result Value Ref Range Status   Specimen Description   Final    SCROTUM  Performed at [facility], [place] [address] [place], [place] [address]    Special Requests   Final    Normal Performed at [facility], [place] [address] [place], [place] [address]    Gram Stain   Final    RARE WBC PRESENT, PREDOMINANTLY PMN RARE GRAM POSITIVE COCCI IN PAIRS IN CLUSTERS    Culture   Final    MODERATE STAPHYLOCOCCUS AUREUS SUSCEPTIBILITIES TO FOLLOW Performed at [facility], [place] [address], [place], [place] [address]    Report Status PENDING  Incomplete     Labs: Basic Metabolic Panel: Recent Labs  Lab 04/24/20 0216 04/25/20 0524 04/26/20 0506  NA 133* 137 137  K 4.2 4.5 5.2*  CL 104 103 104  CO2 22 26 25   GLUCOSE 153* 93 128*  BUN 15 13 18   CREATININE 1.04 0.93 0.85  CALCIUM 8.3* 8.8* 9.2  MG 2.0 2.3 2.3  PHOS 2.6  --   --    Liver Function Tests: Recent Labs  Lab 04/24/20 0216 04/25/20 0524 04/26/20 0506  AST 52* 30 17  ALT 144* 106* 75*  ALKPHOS 190* 255* 220*  BILITOT 1.0 1.0 0.8  PROT 6.1* 6.9 7.1  ALBUMIN 2.8* 2.8* 2.9*   No results for input(s): LIPASE, AMYLASE in the last 168 hours. No results for input(s): AMMONIA in the last 168 hours. CBC: Recent Labs  Lab 04/24/20 0216 04/25/20 0524 04/26/20 0506  WBC 19.4* 16.9* 11.6*  NEUTROABS  --  14.6* 9.7*  HGB 12.5* 13.0 13.5  HCT 37.5* 40.4 41.6  MCV 95.7 98.1 96.5  PLT 170 211 228   Cardiac Enzymes: No results for input(s): CKTOTAL, CKMB, CKMBINDEX, TROPONINI in the  last 168 hours. BNP: BNP (last 3 results) No results for input(s): BNP in the last 8760 hours.  ProBNP (last 3 results) No results for input(s): PROBNP in the last 8760 hours.  CBG: No results for input(s): GLUCAP in the last 168 hours.     Signed:  Irine Seal MD.  [facility] 04/26/2020, 12:59 PM

## 2020-04-26 NOTE — Progress Notes (Signed)
Pt discharged home in stable condition. Discharge instructions and script given. No immediate questions or concerns at this time. Pt discharged from unit via wheelchair.

## 2020-04-26 NOTE — [place] Notes (Incomplete)
PROGRESS NOTE    Rowland Ericsson Westergaard  OZH:[id number] DOB: [birth year] DOA: 04/23/2020 PCP: No primary care provider on file.    No chief complaint on file.   Brief Narrative: HPI per Dr. Marlowe Sax Cher Egnor is a 50 y.o. male with medical history significant of hypertension, GERD presented to [facility] ED today with complaints of worsening swelling, erythema, and pain of his scrotum.  He was seen by his PCP several days ago for suspected simple abscess/cellulitis and placed on antibiotics but the infection continued to worsen.   At [facility], afebrile but tachycardic.  Not hypotensive.  Labs showing WBC 25.0, hemoglobin 14.0, platelet count 179K.  Sodium 133, potassium 4.8, chloride 99, bicarb 29, BUN 16, creatinine 1.0, glucose 115.  Lactic acid 1.9.  AST 99, ALT 216, alk phos 320, T bili 1.5.  UA with negative nitrite, negative leukocytes, 2 WBCs, and few bacteria.  Screening Covid and influenza test negative.   CT abdomen pelvis with contrast done at [facility] showing findings compatible with acute scrotal cellulitis with marked edema and skin thickening.  Associated reactive hydrocele.  Left hemiscrotal ill-defined fluid with enhancement, may represent reactive hydrocele or phlegmon versus early abscess.  Ultrasound recommended for further evaluation.  Scrotal ultrasound was done and revealed marked changes of right than left scrotal skin thickening with complex mixed echogenicity edema/ill-defined tracking scrotal wall fluid compatible with cellulitis and phlegmon reaction.  No defined fluid collection.  No testicular abnormality or evidence of torsion.  Small reactive right hydrocele.  Patient was given 1 L normal saline bolus, hydromorphone, vancomycin, morphine, and Zofran.  ED physician at [facility] discussed the case with on-call urologist Dr. Charlesetta Shanks at [facility] who recommended continuing IV antibiotic but if any signs of decompensation then reimage and  reconsult urology.  Patient states about 10 days ago he noticed an ingrown hair on his scrotum which he tried to remove on his own and clean the area with hydrogen peroxide.  The following day he noticed a small bump in this area.  Since then he has continued to have worsening erythema, pain, and swelling.  States last week he was seen at a clinic and prescribed Bactrim but it did not help and his infection has continued to worsen.  Denies dysuria.  He had chills yesterday but did not check his temperature. No nausea or vomiting.  He has received 1 dose of Moderna Covid vaccine.  Denies cough or shortness of breath.  He reports history of lumbar radiculopathy for which he is seen by orthopedics and receiving steroid injections.  Assessment & Plan:   Principal Problem:   Sepsis ([place]) Active Problems:   Cellulitis, scrotum   Elevated liver enzymes   Alcohol use   Essential hypertension   Lumbar radiculopathy   Leukocytosis  1 sepsis secondary to acute scrotal cellulitis with phlegmon -On admission patient met criteria for sepsis with tachycardia, leukocytosis, scrotal cellulitis. -Scrotum noted to be edematous, erythematous extending to the perineal region and inner thigh on presentation with some exquisite right inguinal tenderness to palpation and induration. -Urinalysis done not suggestive of infection. -Scrotal ultrasound done at [facility] showed marked changes of right and left scrotal skin thickening with complex mixed echogenicity edema/ill-defined tracking scrotal wall fluid compatible with cellulitis and phlegmon reaction, no defined fluid collection. -CT done at [facility] with no findings to suggest Fournier's gangrene. -Patient with some exquisite tenderness and induration in the GU region which is slowly improving. -Patient seen in consultation by urology,  new area of fluctuance noted and as such scrotal ultrasound done this morning to rule out abscess formation  which may need I and D. -Leukocytosis trending down.  Afebrile. -Continue IV vancomycin, IV Unasyn. -Continue gentle hydration and saline lock IV fluids in the next 24 hours. -Continue current pain management..  2.  Lumbar radiculopathy -Patient and wife seem frustrated by work-up in the outpatient setting by orthopedics for lumbar radiculopathy. -Family MRI was to be obtained however has been taking a while. -Has been noted that patient has received some steroid injections. -Patient still with significant pain which is described as radiating from right buttocks region down his lower extremity to his anterior shin and dorsal aspect of foot. -Patient denies any bowel or urinary incontinence. -Patient with no significant lower extremity weakness. -MRI of L-spine (04/24/2020) with moderate size extraforaminal disc extrusion on the right L5-S1 with probable resulting extraforaminal right L5 nerve root with encroachment.  Mild disc bulging and small left foraminal annular rent at L4-5 without nerve root encroachment.  Mild disc bulging contributing to mild foraminal narrowing bilaterally at L3-4.  -Case discussed with neurosurgery Glenford Peers, NP) who does not feel a formal consultation is needed at this time and recommending steroids and pain management with outpatient follow-up.  -Continue current regimen of Ultram 3 times daily, IV Toradol as needed, gabapentin, Zanaflex.  -We will give Decadron 10 mg IV x1 today and then Decadron 4 mg p.o. every 8 hours for 24 hours and subsequently placed on a Medrol Dosepak per neurosurgery recommendations.  -We will need outpatient follow-up with neurosurgery.  3.  Hypertension -Losartan.  4.  Transaminitis -Patient noted to have daily alcohol use. -Right upper quadrant ultrasound obtained with no acute hepatobiliary findings.  No cholelithiasis.  1.7 cm liver cyst. -LFTs trending down. -Acute hepatitis panel pending.  -Repeat labs in the  morning.  5.  Leukocytosis Likely secondary to problem #1. -Blood cultures ordered and pending.  Urine cultures pending. -Leukocytosis trending down.   -Continue IV vancomycin, IV Unasyn.  -Follow.  6.  Chronic alcohol use -Patient with no signs of withdrawal.   -Continue Ativan withdrawal protocol, thiamine, folic acid, multivitamin.   -Supportive care.     DVT prophylaxis: Heparin >>>> Lovenox  Code Status: Full Family Communication: Updated patient.  No family at bedside.   Disposition:   Status is: Inpatient    Dispo: The patient is from: Home              Anticipated d/c is to: Home              Patient currently with scrotal cellulitis, some induration in the scrotal region, on IV antibiotics, repeat scrotal ultrasound pending urology consultation pending.   Difficult to place patient No       Consultants:   Urology: Dr. Sheppard Coil 04/24/2020  Procedures:   MRI L-spine: 04/24/2020  Scrotal ultrasound pending 04/25/2020  Antimicrobials:   IV vancomycin 04/24/2020>>>>  IV Unasyn 04/24/2020>>>>   Subjective: Standing up in room.  Denies chest pain or shortness of breath.  No abdominal pain.  Feels right lower extremity pain with burning and tingling with some improvement on current pain regimen.  Denies any right lower extremity weakness.  Denies any bowel or urinary incontinence.  Complain of constipation. Feels scrotal swelling, erythema and edema slowly improving. Scrotal ultrasound just completed this morning  Objective: Vitals:   04/25/20 0459 04/25/20 1407 04/25/20 2026 04/26/20 0501  BP: (!) 152/98 136/90 (!) 159/94 138/85  Pulse: 66 72 84 67  Resp: 19 15 18 18   Temp: (!) 97.5 F (36.4 C) 98.2 F (36.8 C) (!) 97.5 F (36.4 C) (!) 97.5 F (36.4 C)  TempSrc: Oral Oral Oral Oral  SpO2: 99% 100% 100% 99%  Weight:      Height:        Intake/Output Summary (Last 24 hours) at 04/26/2020 1003 Last data filed at 04/26/2020 0418 Gross per 24 hour  Intake  1176.52 ml  Output -  Net 1176.52 ml   Filed Weights   04/24/20 1301  Weight: 97.8 kg    Examination:  General exam: NAD Respiratory system: CTA B.  No wheezes, no crackles, no rhonchi.  Normal respiratory effort.  Cardiovascular system: Regular rate rhythm no murmurs rubs or gallops.  No JVD.  No lower extremity edema. Gastrointestinal system: Abdomen is soft, nontender, nondistended, positive bowel sounds.  No rebound.  No guarding. Central nervous system: Alert and oriented. No focal neurological deficits. Genitourinary: Scrotum with decreased erythema, some decreased swelling and tightness right greater than left standing into perineal region and inner thigh, right inguinal region exquisitely tender to palpation with decreased induration.  Right hemiscrotal with some fluctuance in the superior aspect. Extremities: Symmetric 5 x 5 power. Skin: No rashes, lesions or ulcers Psychiatry: Judgement and insight appear normal. Mood & affect appropriate.     Data Reviewed: I have personally reviewed following labs and imaging studies  CBC: Recent Labs  Lab 04/24/20 0216 04/25/20 0524 04/26/20 0506  WBC 19.4* 16.9* 11.6*  NEUTROABS  --  14.6* 9.7*  HGB 12.5* 13.0 13.5  HCT 37.5* 40.4 41.6  MCV 95.7 98.1 96.5  PLT 170 211 226    Basic Metabolic Panel: Recent Labs  Lab 04/24/20 0216 04/25/20 0524 04/26/20 0506  NA 133* 137 137  K 4.2 4.5 5.2*  CL 104 103 104  CO2 22 26 25   GLUCOSE 153* 93 128*  BUN 15 13 18   CREATININE 1.04 0.93 0.85  CALCIUM 8.3* 8.8* 9.2  MG 2.0 2.3 2.3  PHOS 2.6  --   --     GFR: Estimated Creatinine Clearance: 125.2 mL/min (by C-G formula based on SCr of 0.85 mg/dL).  Liver Function Tests: Recent Labs  Lab 04/24/20 0216 04/25/20 0524 04/26/20 0506  AST 52* 30 17  ALT 144* 106* 75*  ALKPHOS 190* 255* 220*  BILITOT 1.0 1.0 0.8  PROT 6.1* 6.9 7.1  ALBUMIN 2.8* 2.8* 2.9*    CBG: No results for input(s): GLUCAP in the last 168  hours.   Recent Results (from the past 240 hour(s))  Culture, blood (routine x 2)     Status: None (Preliminary result)   Collection Time: 04/24/20  2:16 AM   Specimen: BLOOD  Result Value Ref Range Status   Specimen Description   Final    BLOOD RIGHT ANTECUBITAL Performed at [facility] [address] [place], [place] [address]    Special Requests   Final    BOTTLES DRAWN AEROBIC ONLY Blood Culture results may not be optimal due to an excessive volume of blood received in culture bottles Performed at [facility] [address], [place], [place] [address]    Culture   Final    NO GROWTH 1 DAY Performed at [facility], [place] [address] [place], [place] [address]    Report Status PENDING  Incomplete  Culture, blood (routine x 2)     Status: None (Preliminary result)  Collection Time: 04/24/20  2:16 AM   Specimen: BLOOD RIGHT HAND  Result Value Ref Range Status   Specimen Description   Final    BLOOD RIGHT HAND Performed at [facility] [address] [place], [place] [address]    Special Requests   Final    BOTTLES DRAWN AEROBIC ONLY Blood Culture adequate volume Performed at [facility] [address], [place], [place] [address]    Culture   Final    NO GROWTH 1 DAY Performed at [facility], [place] [address] [place], [place] [address]    Report Status PENDING  Incomplete  Aerobic Culture w Gram Stain (superficial specimen)     Status: None (Preliminary result)   Collection Time: 04/25/20  3:28 PM   Specimen: Scrotum  Result Value Ref Range Status   Specimen Description   Final    SCROTUM Performed at [facility] [address] [place], [place] [address]    Special Requests   Final    Normal Performed at [facility], [place] [address], [place], [place] [address]    Gram Stain   Final    RARE WBC PRESENT, PREDOMINANTLY PMN RARE GRAM  POSITIVE COCCI IN PAIRS IN CLUSTERS    Culture   Final    MODERATE STAPHYLOCOCCUS AUREUS SUSCEPTIBILITIES TO FOLLOW Performed at [facility], [place] [address], [place],  [address]    Report Status PENDING  Incomplete         Radiology Studies: MR Lumbar Spine W Wo Contrast  Result Date: 04/24/2020 CLINICAL DATA:  Chronic low back pain radiating into the hips, laterality not specified. No known injury. EXAM: MRI LUMBAR SPINE WITHOUT AND WITH CONTRAST TECHNIQUE: Multiplanar and multiecho pulse sequences of the lumbar spine were obtained without and with intravenous contrast. CONTRAST:  68m GADAVIST GADOBUTROL 1 MMOL/ML IV SOLN COMPARISON:  Lumbar spine radiographs 04/09/2020. Abdominopelvic CT 04/23/2020. FINDINGS: Segmentation: Conventional anatomy assumed, with the last open disc space designated L5-S1.Concordant with previous imaging. Alignment:  Physiologic. Vertebrae: No worrisome osseous lesion, acute fracture or pars defect. The visualized sacroiliac joints appear unremarkable. Conus medullaris: Extends to the L1-2 level and appears normal. No abnormal intradural enhancement. Paraspinal and other soft tissues: No significant paraspinal findings. Disc levels: From T12-L1 through L2-3, the discs are hydrated with maintained height. No spinal stenosis or nerve root encroachment. L3-4: Mild annular disc bulging and mild loss of disc height. Mild bilateral facet hypertrophy. There is mild narrowing of the foramina bilaterally without nerve root encroachment. L4-5: Mild annular disc bulging with a small left foraminal annular rent. Mild asymmetric narrowing of the left lateral recess and left foramen without definite nerve root encroachment. L5-S1: Mild disc bulging with a moderate-sized extraforaminal disc extrusion on the right. This likely causes right extraforaminal L5 nerve root encroachment. The left foramen and lateral recesses are patent. IMPRESSION: 1. Moderate size extraforaminal  disc extrusion on the right at L5-S1 with probable resulting extraforaminal right L5 nerve root encroachment. Correlate with specific radicular symptoms. 2. Mild disc bulging and small left foraminal annular rent at L4-5, without nerve root encroachment. 3. Mild disc bulging contributing to mild foraminal narrowing bilaterally at L3-4. 4. No acute osseous findings. Electronically Signed   By: WRichardean SaleM.D.   On: 04/24/2020 15:00   [place]  Result Date: 04/25/2020 CLINICAL DATA:  Inpatient.  Right scrotal swelling for 2 weeks. EXAM: ULTRASOUND OF SCROTUM TECHNIQUE: Complete ultrasound examination  of the testicles, epididymis, and other scrotal structures was performed. COMPARISON:  None. FINDINGS: There is a complex 7.1 x 2.9 x 6.7 cm irregular fluid collection in the deep soft tissues of the anterior superior right scrotum with surrounding hyperemia on color Doppler, no internal vascularity and overlying skin thickening, compatible with an abscess. This collection appears to involve the deep subcutaneous soft tissues and does not appear to involve the tunica vaginalis, testis or epididymis. Right testicle Measurements: 4.5 x 2.6 x 3.5 cm. No mass or microlithiasis visualized. Preserved vascularity on color Doppler in the right testis. Left testicle Measurements: 3.3 x 1.6 x 3.5 cm. No mass or microlithiasis visualized. Preserved vascularity on color Doppler in the left testis. Right epididymis:  Normal in size and appearance. Left epididymis:  Normal in size and appearance. Hydrocele:  No significant hydroceles. Varicocele:  None visualized. IMPRESSION: 1. Complex 7.1 x 2.9 x 6.7 cm irregular fluid collection in the deep subcutaneous soft tissues of the anterior superior right scrotum with surrounding hyperemia and overlying skin thickening, compatible with abscess. This collection does not appear to involve the tunica vaginalis, testis or epididymis. 2. Otherwise normal scrotal sonogram.  Normal  testes. Electronically Signed   By: Ilona Sorrel M.D.   On: 04/25/2020 12:41        Scheduled Meds: . dexamethasone  4 mg Oral Q8H  . enoxaparin (LOVENOX) injection  40 mg Subcutaneous Q24H  . folic acid  1 mg Oral Daily  . gabapentin  600 mg Oral BID  . lidocaine (PF)  10 mL Intradermal Once  . LORazepam  0-4 mg Intravenous Q12H  . losartan  100 mg Oral Daily  . multivitamin with minerals  1 tablet Oral Daily  . multivitamin with minerals  1 tablet Oral Daily  . pantoprazole  40 mg Oral Q0600  . polyethylene glycol  17 g Oral Daily  . senna-docusate  1 tablet Oral BID  . thiamine  100 mg Oral Daily   Or  . thiamine  100 mg Intravenous Daily  . thiamine  100 mg Oral Daily   Or  . thiamine  100 mg Intravenous Daily   Continuous Infusions: . ampicillin-sulbactam (UNASYN) IV 3 g (04/26/20 1749)  . vancomycin Stopped (04/26/20 0022)     LOS: 3 days    Time spent: 40 minutes    Irine Seal, MD [facility]   To contact the attending provider between 7A-7P or the covering provider during after hours 7P-7A, please log into the web site [email or web address] and access using universal [place] password for that web site. If you do not have the password, please call the hospital operator.  04/26/2020, 10:03 AM

## 2020-04-26 NOTE — Progress Notes (Signed)
Subjective: Patient reports feeling better since his I&D yesterday.  Objective: Vital signs in last 24 hours: Temp:  [97.5 F (36.4 C)-98.2 F (36.8 C)] 97.5 F (36.4 C) (04/04 0501) Pulse Rate:  [67-84] 67 (04/04 0501) Resp:  [15-18] 18 (04/04 0501) BP: (136-159)/(85-94) 138/85 (04/04 0501) SpO2:  [99 %-100 %] 99 % (04/04 0501)  Intake/Output from previous day: 04/03 0701 - 04/04 0700 In: 1416.5 [P.O.:720; IV Piggyback:696.5] Out: -  Intake/Output this shift: No intake/output data recorded.  Physical Exam:  Constitutional: Vital signs reviewed. WD WN in NAD   Eyes: PERRL, No scleral icterus.   Cardiovascular: RRR Pulmonary/Chest: Normal effort Abdominal: Soft. Non-tender, non-distended, bowel sounds are normal, no masses, organomegaly, or guarding present.  Genitourinary: Still with scrotal induration/erythema/edema.  Packing was removed and replaced with iodoform gauze.  Still a mild amount of purulent drainage.  No crepitus. Extremities: No cyanosis or edema   Lab Results: Recent Labs    04/24/20 0216 04/25/20 0524 04/26/20 0506  HGB 12.5* 13.0 13.5  HCT 37.5* 40.4 41.6   BMET Recent Labs    04/25/20 0524 04/26/20 0506  NA 137 137  K 4.5 5.2*  CL 103 104  CO2 26 25  GLUCOSE 93 128*  BUN 13 18  CREATININE 0.93 0.85  CALCIUM 8.8* 9.2   No results for input(s): LABPT, INR in the last 72 hours. No results for input(s): LABURIN in the last 72 hours. Results for orders placed or performed during the hospital encounter of 04/23/20  Culture, blood (routine x 2)     Status: None (Preliminary result)   Collection Time: 04/24/20  2:16 AM   Specimen: BLOOD  Result Value Ref Range Status   Specimen Description   Final    BLOOD RIGHT ANTECUBITAL Performed at [facility], [address] [place], [place] [address]    Special Requests   Final    BOTTLES DRAWN AEROBIC ONLY Blood Culture results may not be optimal due to an excessive volume of  blood received in culture bottles Performed at [facility], [address] [place], [place] [address]    Culture   Final    NO GROWTH 1 DAY Performed at [facility], [address], [place], [place] [address]    Report Status PENDING  Incomplete  Culture, blood (routine x 2)     Status: None (Preliminary result)   Collection Time: 04/24/20  2:16 AM   Specimen: BLOOD RIGHT HAND  Result Value Ref Range Status   Specimen Description   Final    BLOOD RIGHT HAND Performed at [facility], [address], [place], [place] [address]    Special Requests   Final    BOTTLES DRAWN AEROBIC ONLY Blood Culture adequate volume Performed at [facility], [address], [place], [place] [address]    Culture   Final    NO GROWTH 1 DAY Performed at [facility], [address], [place], [place] [address]    Report Status PENDING  Incomplete  Aerobic Culture w Gram Stain (superficial specimen)     Status: None (Preliminary result)   Collection Time: 04/25/20  3:28 PM   Specimen: Scrotum  Result Value Ref Range Status   Specimen Description   Final    SCROTUM Performed at [facility], [address], [place], [place] [address]    Special Requests   Final    Normal Performed at [facility], [address] [place], [place] [address]    Gram Stain   Final    RARE WBC PRESENT, PREDOMINANTLY PMN RARE GRAM POSITIVE COCCI IN PAIRS IN CLUSTERS    Culture   Final    MODERATE STAPHYLOCOCCUS AUREUS SUSCEPTIBILITIES TO FOLLOW Performed at [facility], [address] [place], [place] [address]    Report Status PENDING  Incomplete    Studies/Results: MR Lumbar Spine W Wo Contrast  Result Date: 04/24/2020 CLINICAL DATA:  Chronic low back pain radiating into the hips, laterality not specified. No known injury. EXAM: MRI LUMBAR SPINE WITHOUT AND WITH CONTRAST TECHNIQUE: Multiplanar  and multiecho pulse sequences of the lumbar spine were obtained without and with intravenous contrast. CONTRAST:  47mL GADAVIST GADOBUTROL 1 MMOL/ML IV SOLN COMPARISON:  Lumbar spine radiographs 04/09/2020. Abdominopelvic CT 04/23/2020. FINDINGS: Segmentation: Conventional anatomy assumed, with the last open disc space designated L5-S1.Concordant with previous imaging. Alignment:  Physiologic. Vertebrae: No worrisome osseous lesion, acute fracture or pars defect. The visualized sacroiliac joints appear unremarkable. Conus medullaris: Extends to the L1-2 level and appears normal. No abnormal intradural enhancement. Paraspinal and other soft tissues: No significant paraspinal findings. Disc levels: From T12-L1 through L2-3, the discs are hydrated with maintained height. No spinal stenosis or nerve root encroachment. L3-4: Mild annular disc bulging and mild loss of disc height. Mild bilateral facet hypertrophy. There is mild narrowing of the foramina bilaterally without nerve root encroachment. L4-5: Mild annular disc bulging with a small left foraminal annular rent. Mild asymmetric narrowing of the left lateral recess and left foramen without definite nerve root encroachment. L5-S1: Mild disc bulging with a moderate-sized extraforaminal disc extrusion on the right. This likely causes right extraforaminal L5 nerve root encroachment. The left foramen and lateral recesses are patent. IMPRESSION: 1. Moderate size extraforaminal disc extrusion on the right at L5-S1 with probable resulting extraforaminal right L5 nerve root encroachment. Correlate with specific radicular symptoms. 2. Mild disc bulging and small left foraminal annular rent at L4-5, without nerve root encroachment. 3. Mild disc bulging contributing to mild foraminal narrowing bilaterally at L3-4. 4. No acute osseous findings. Electronically Signed   By: Carey Bullocks M.D.   On: 04/24/2020 15:00   US SCROTUM  Result Date: 04/25/2020 CLINICAL DATA:   Inpatient.  Right scrotal swelling for 2 weeks. EXAM: ULTRASOUND OF SCROTUM TECHNIQUE: Complete ultrasound examination of the testicles, epididymis, and other scrotal structures was performed. COMPARISON:  None. FINDINGS: There is a complex 7.1 x 2.9 x 6.7 cm irregular fluid collection in the deep soft tissues of the anterior superior right scrotum with surrounding hyperemia on color Doppler, no internal vascularity and overlying skin thickening, compatible with an abscess. This collection appears to involve the deep subcutaneous soft tissues and does not appear to involve the tunica vaginalis, testis or epididymis. Right testicle Measurements: 4.5 x 2.6 x 3.5 cm. No mass or microlithiasis visualized. Preserved vascularity on color Doppler in the right testis. Left testicle Measurements: 3.3 x 1.6 x 3.5 cm. No mass or microlithiasis visualized. Preserved vascularity on color Doppler in the left testis. Right epididymis:  Normal in size and appearance. Left epididymis:  Normal in size and appearance. Hydrocele:  No significant hydroceles. Varicocele:  None visualized. IMPRESSION: 1. Complex 7.1 x 2.9 x 6.7 cm irregular fluid collection in the deep subcutaneous soft tissues of the anterior superior right scrotum with surrounding hyperemia and overlying skin thickening, compatible with abscess. This collection does not appear to involve the tunica  vaginalis, testis or epididymis. 2. Otherwise normal scrotal sonogram.  Normal testes. Electronically Signed   By: Delbert Phenix M.D.   On: 04/25/2020 12:41    Assessment/Plan:   Scrotal abscess, status post drainage yesterday.  Expected improvement of the appearance of his scrotum.  His packing was removed and replaced today    I think it is fine to go home on 7 days of antibiotics    We will call him to set up an appointment in 3 days to be seen.  He knows to call us sooner if he feels like he is getting worse   LOS: 3 days   Chelsea Aus 04/26/2020,  9:36 AM

## 2020-04-26 NOTE — TOC Initial Note (Signed)
Transition of Care [facility]) - Initial/Assessment Note    Patient Details  Name: Andrew Roberts MRN: [id number] Date of Birth: [birth year]  Transition of Care [facility]) CM/SW Contact:    Bartholome Bill, RN Phone Number: 04/26/2020, 12:56 PM  Clinical Narrative:                  [facility] consult for Substance Abuse Counseling/Resources. Resources placed on AVS.         Expected Discharge Date: 04/26/20                          Activities of Daily Living Home Assistive Devices/Equipment: None ADL Screening (condition at time of admission) Patient's cognitive ability adequate to safely complete daily activities?: Yes Is the patient deaf or have difficulty hearing?: No Does the patient have difficulty seeing, even when wearing glasses/contacts?: No Does the patient have difficulty concentrating, remembering, or making decisions?: No Patient able to express need for assistance with ADLs?: Yes Does the patient have difficulty dressing or bathing?: No Independently performs ADLs?: Yes (appropriate for developmental age) Does the patient have difficulty walking or climbing stairs?: No Weakness of Legs: None Weakness of Arms/Hands: None  Admission diagnosis:  Cellulitis, scrotum [N49.2] Patient Active Problem List   Diagnosis Date Noted  . Scrotal abscess 04/26/2020  . Cellulitis, scrotum 04/24/2020  . Sepsis (HCC) 04/24/2020  . Elevated liver enzymes 04/24/2020  . Alcohol use 04/24/2020  . Essential hypertension 04/24/2020  . Lumbar radiculopathy 04/24/2020  . Leukocytosis 04/24/2020   PCP:  No primary care provider on file. Pharmacy:   [facility] [address], [place] - [address] [place] [address] Phone: [phone] Fax: [phone]     Social Determinants of Health (SDOH) Interventions    Readmission Risk Interventions No flowsheet data found.

## 2020-04-27 LAB — AEROBIC CULTURE W GRAM STAIN (SUPERFICIAL SPECIMEN): Special Requests: NORMAL

## 2020-04-29 LAB — CULTURE, BLOOD (ROUTINE X 2)
Culture: NO GROWTH
Culture: NO GROWTH
Special Requests: ADEQUATE

## 2022-03-21 IMAGING — MR MR LUMBAR SPINE WO/W CM
7 of 10 series · 25 of 48 positions shown · IV contrast (GADAVIST)
Comparison: Lumbar spine radiographs 04/09/2020. Abdominopelvic CT
04/23/2020.

CLINICAL DATA: Chronic low back pain radiating into the hips,
laterality not specified. No known injury.

EXAM:
MRI LUMBAR SPINE WITHOUT AND WITH CONTRAST
TECHNIQUE: Multiplanar and multiecho pulse sequences of the lumbar spine were
obtained without and with intravenous contrast.
CONTRAST:  9mL GADAVIST GADOBUTROL 1 MMOL/ML IV SOLN

[Series 5: T1 · sagittal · 4.0mm · 0.81mm/px · 2 of 17 slices shown (1 of 2)]
[im 1/17]
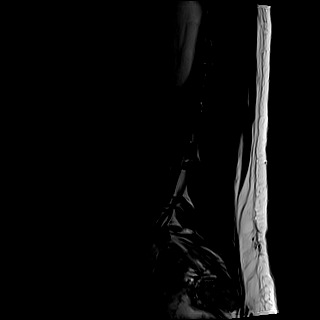
[im 17/17]
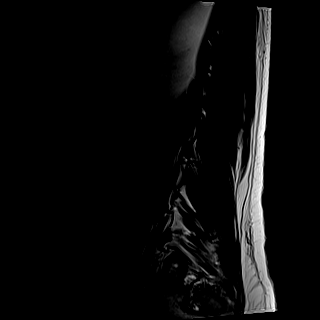

[Series 6: STIR · sagittal · 4.0mm · 0.51mm/px · 2 of 17 slices shown]
[im 1/17]
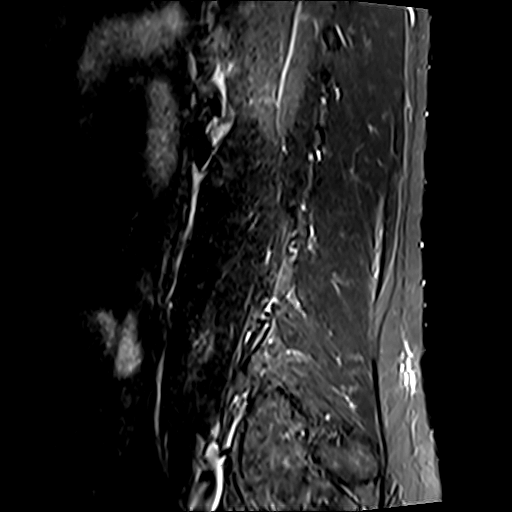
[im 17/17]
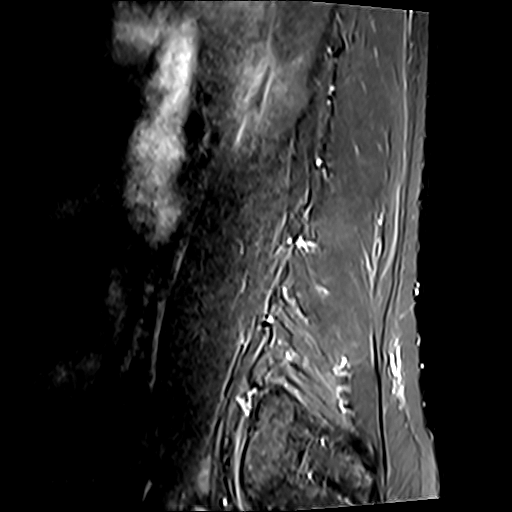

[Series 7: T2 · axial · 4.0mm · 0.62mm/px · z∈[-73,+158]mm · 6 of 43 slices shown]
[im 1/43]
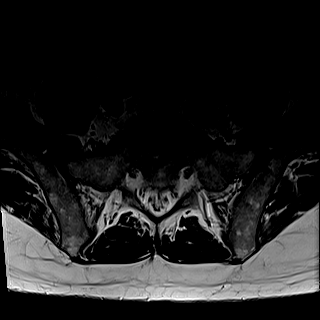
[im 9/43]
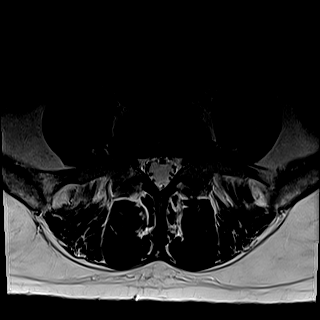
[im 17/43]
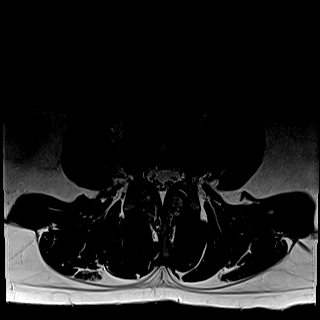
[im 26/43]
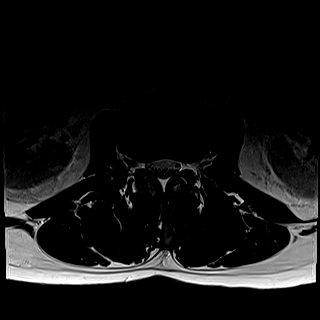
[im 34/43]
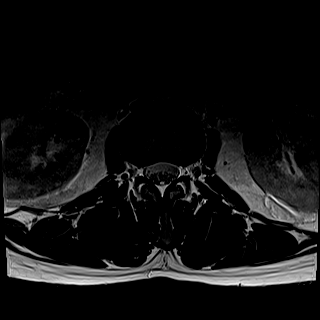
[im 43/43]
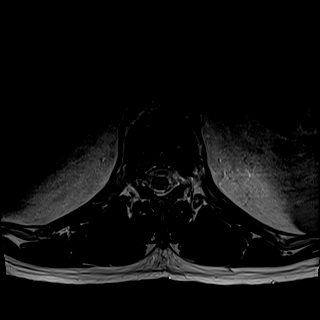

[Series 8: T1 · axial · 4.0mm · 0.39mm/px · z∈[-73,+158]mm · 6 of 43 slices shown (2 of 2)]
[im 1/43]
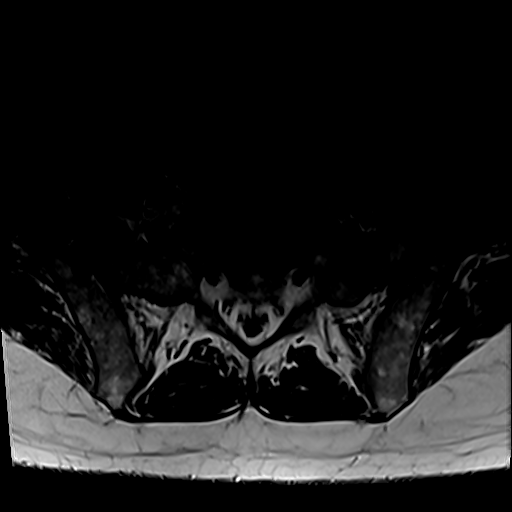
[im 9/43]
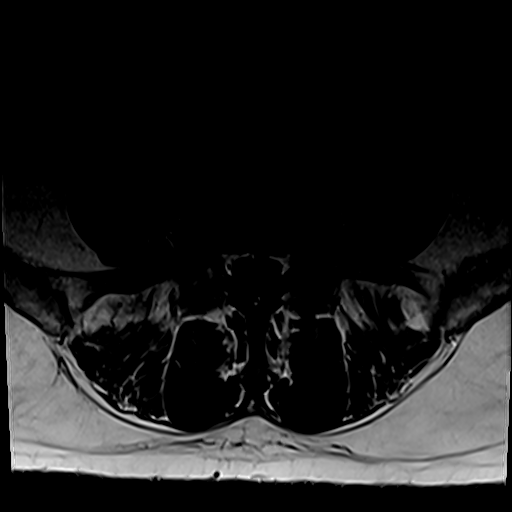
[im 17/43]
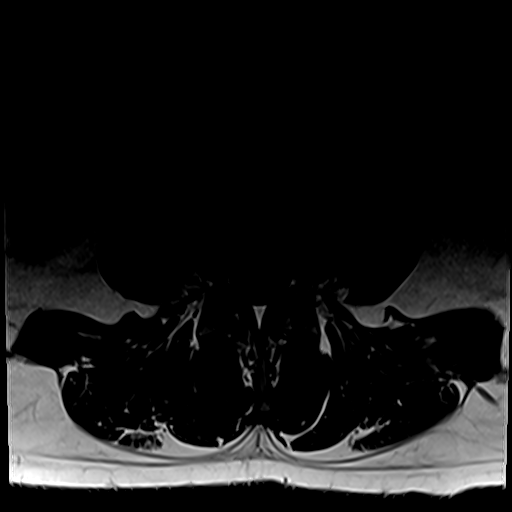
[im 26/43]
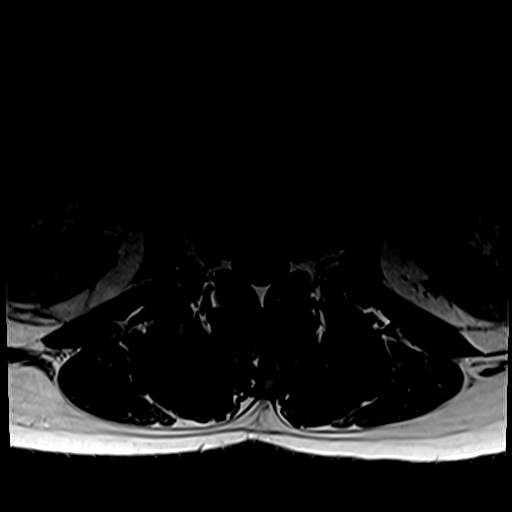
[im 34/43]
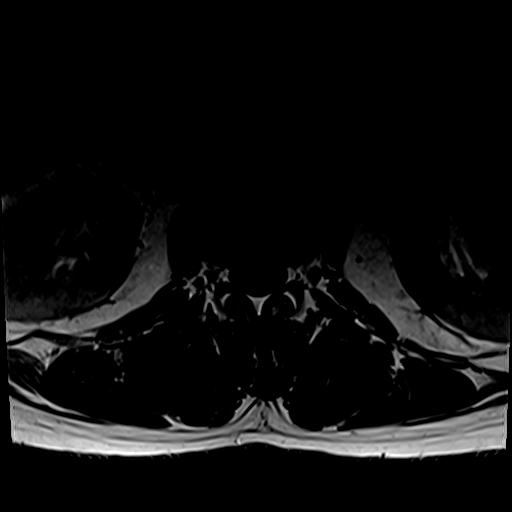
[im 43/43]
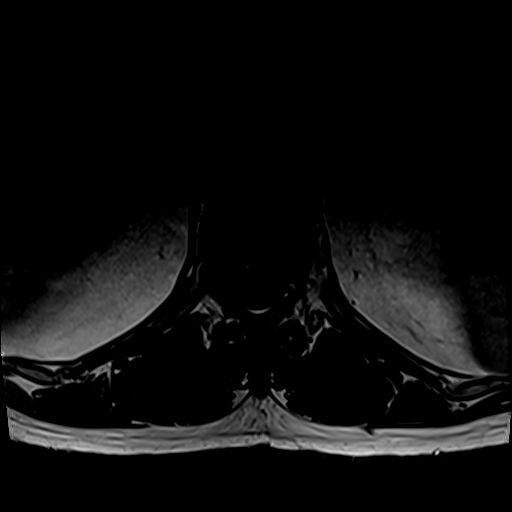

[Series 9: T2 post-contrast · sagittal · 4.0mm · 0.81mm/px · 2 of 17 slices shown]
[im 1/17]
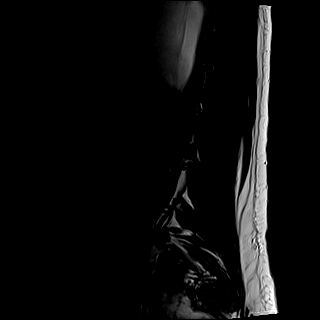
[im 17/17]
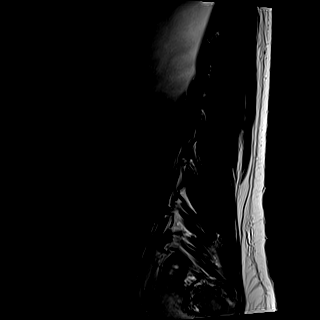

[Series 10: T1 fat-sat post-contrast · sagittal · 4.0mm · 0.81mm/px · 2 of 17 slices shown]
[im 1/17]
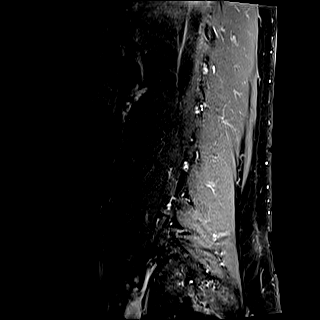
[im 17/17]
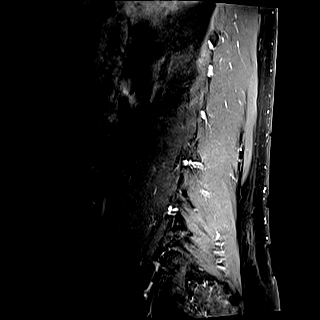

[Series 11: T1 post-contrast · axial · 4.0mm · 0.43mm/px · z∈[-74,+115]mm · 5 of 43 slices shown]
[im 1/43]
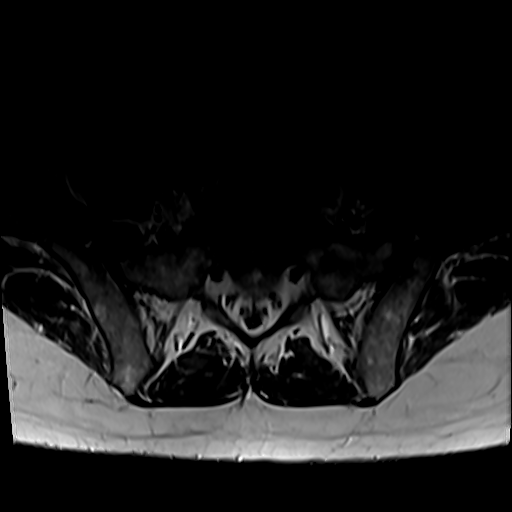
[im 9/43]
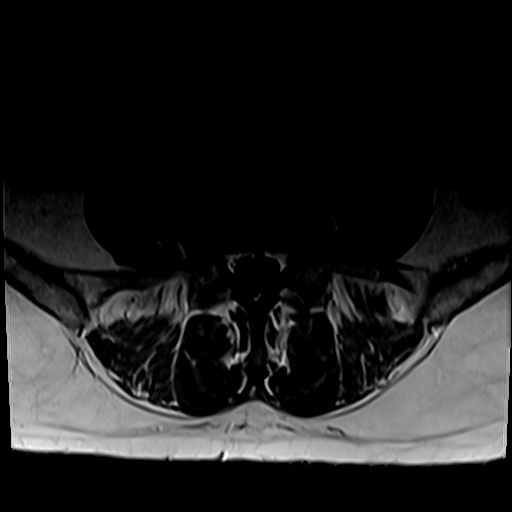
[im 17/43]
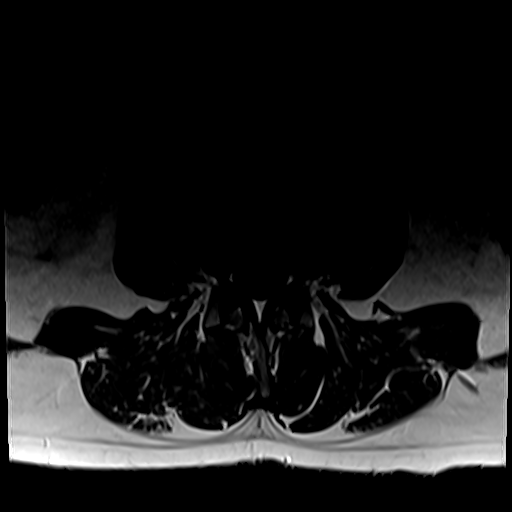
[im 26/43]
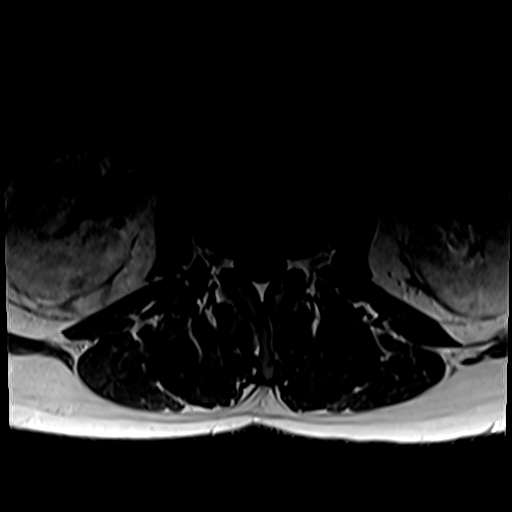
[im 34/43]
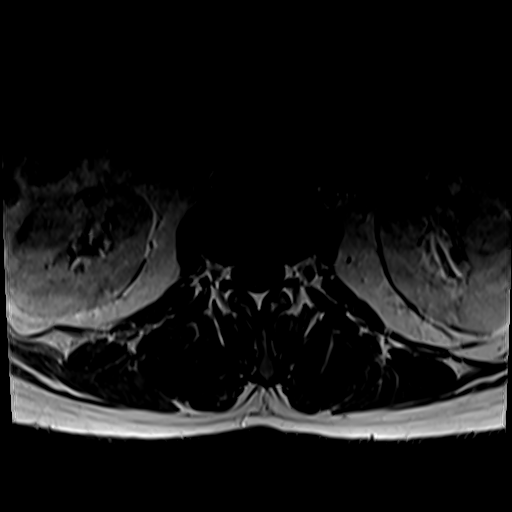

[25 of 48 positions shown; findings below may reference images not displayed]

FINDINGS: Segmentation: Conventional anatomy assumed, with the last open disc
space designated L5-S1.Concordant with previous imaging.

Alignment:  Physiologic.

Vertebrae: No worrisome osseous lesion, acute fracture or pars
defect. The visualized sacroiliac joints appear unremarkable.

Conus medullaris: Extends to the L1-2 level and appears normal. No
abnormal intradural enhancement.

Paraspinal and other soft tissues: No significant paraspinal
findings.

Disc levels:

From T12-L1 through L2-3, the discs are hydrated with maintained
height. No spinal stenosis or nerve root encroachment.

L3-4: Mild annular disc bulging and mild loss of disc height. Mild
bilateral facet hypertrophy. There is mild narrowing of the foramina
bilaterally without nerve root encroachment.

L4-5: Mild annular disc bulging with a small left foraminal annular
rent. Mild asymmetric narrowing of the left lateral recess and left
foramen without definite nerve root encroachment.

L5-S1: Mild disc bulging with a moderate-sized extraforaminal disc
extrusion on the right. This likely causes right extraforaminal L5
nerve root encroachment. The left foramen and lateral recesses are
patent.
IMPRESSION: 1. Moderate size extraforaminal disc extrusion on the right at L5-S1
with probable resulting extraforaminal right L5 nerve root
encroachment. Correlate with specific radicular symptoms.
2. Mild disc bulging and small left foraminal annular rent at L4-5,
without nerve root encroachment.
3. Mild disc bulging contributing to mild foraminal narrowing
bilaterally at L3-4.
4. No acute osseous findings.

## 2022-03-21 IMAGING — US US ABDOMEN LIMITED RUQ/ASCITES
1 series · 14 of 25 positions shown · non-contrast
Comparison: CT 04/23/2020

CLINICAL DATA: Elevated LFTs.

EXAM:
ULTRASOUND ABDOMEN LIMITED RIGHT UPPER QUADRANT

[Series 1: us abdomen limited ruq/ascites · 33 acquisitions, 14 frames shown]
[im 1/33]
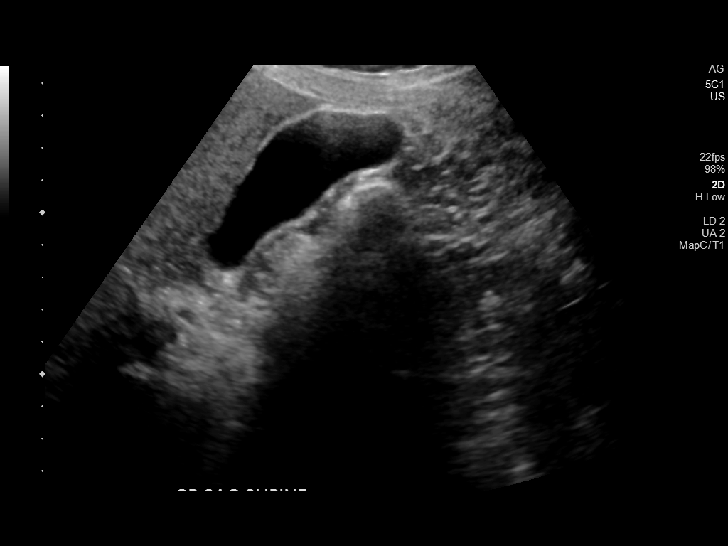
[im 3/33]
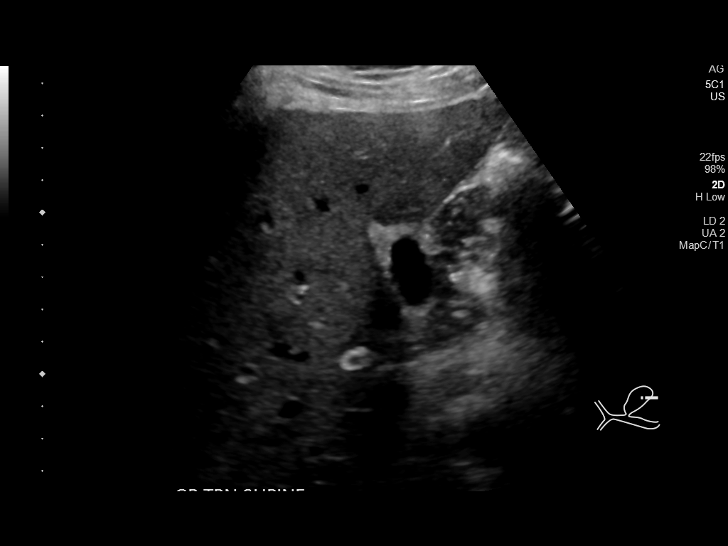
[im 6/33]
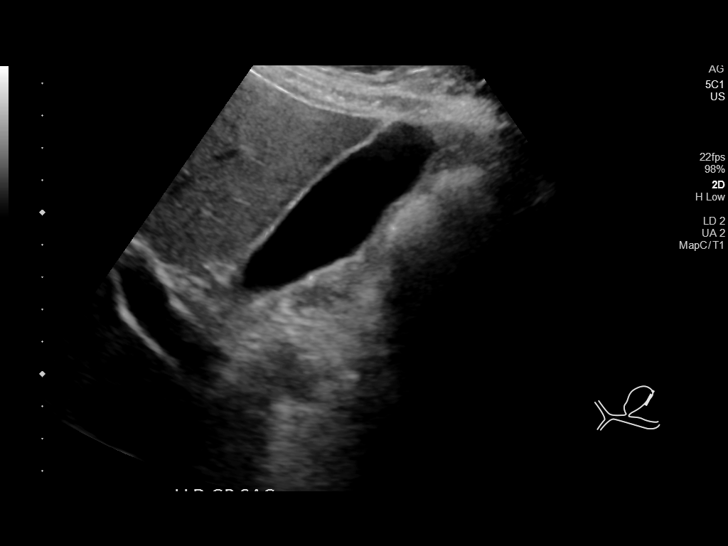
[im 9/33]
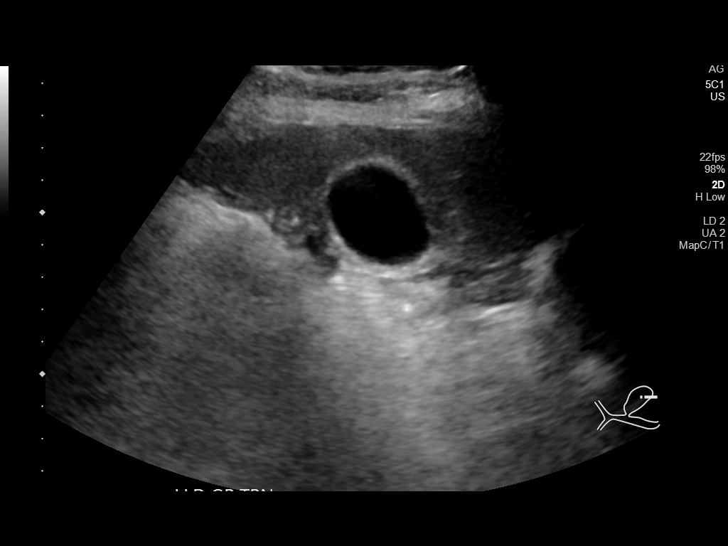
[im 11/33]
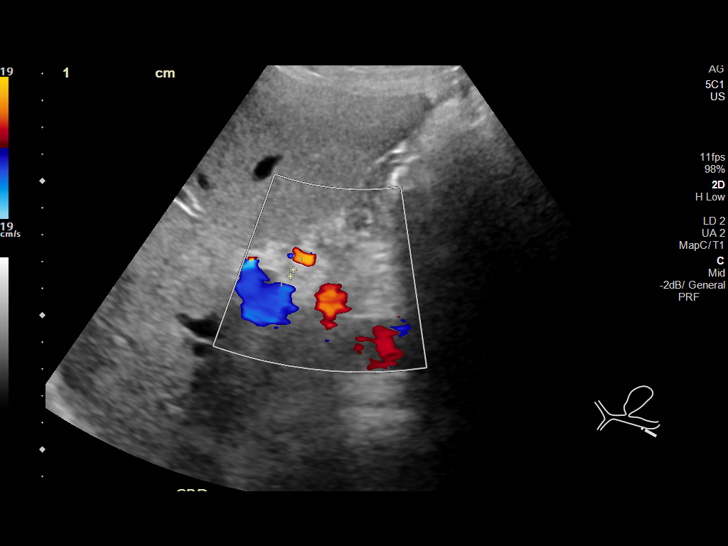
[im 13/33]
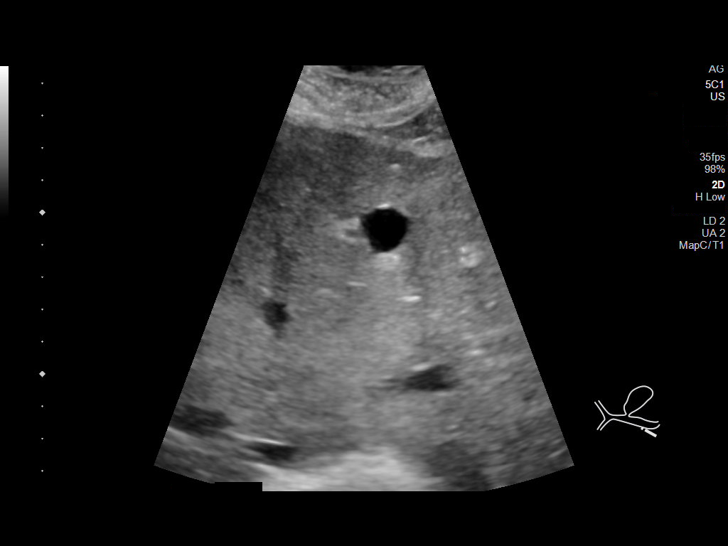
[im 15/33]
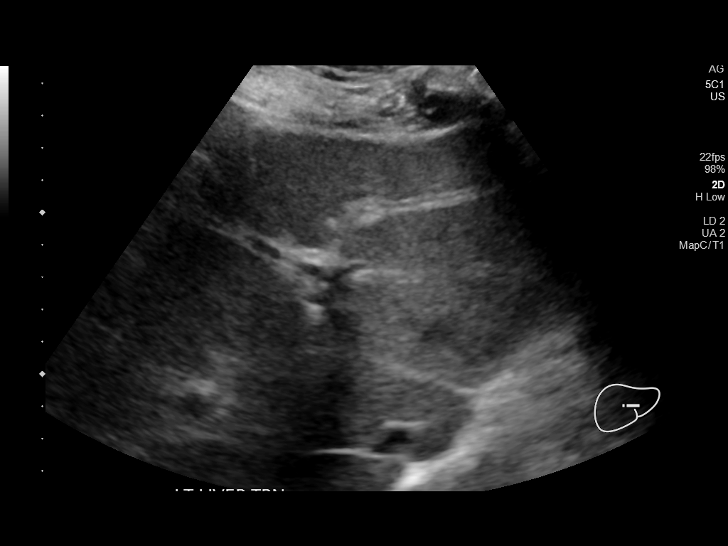
[im 18/33]
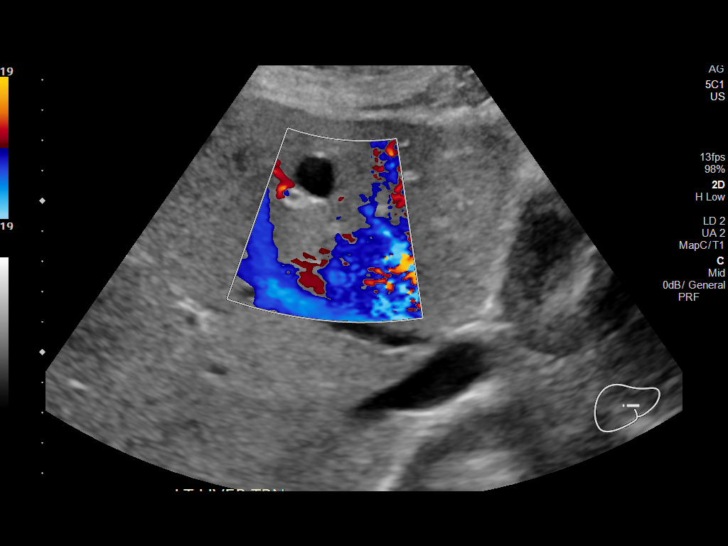
[im 21/33]
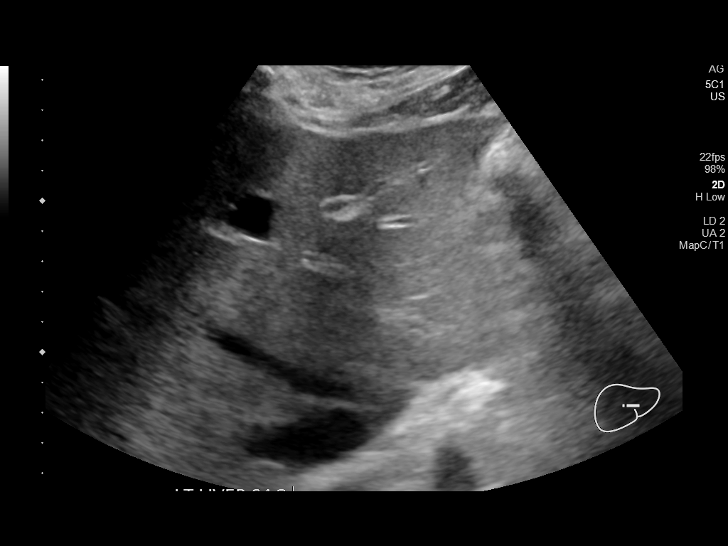
[im 22/33]
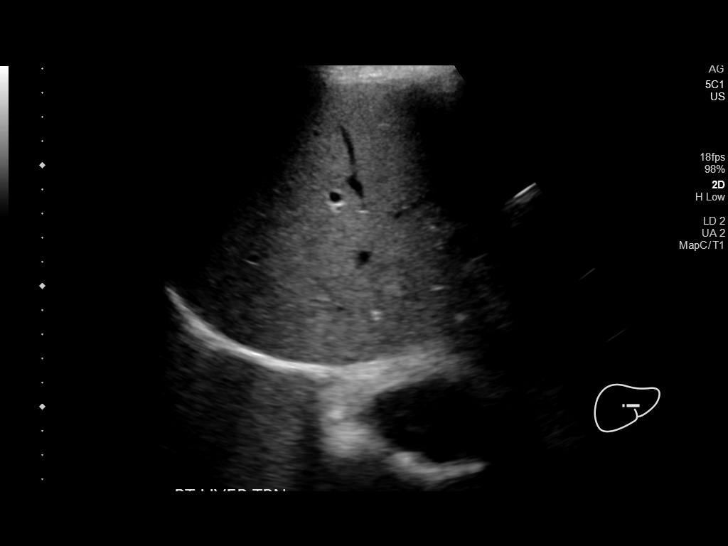
[im 25/33]
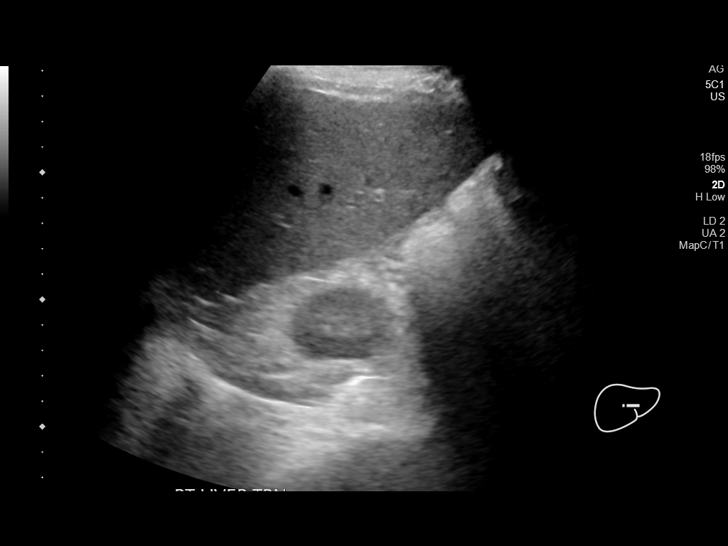
[im 27/33]
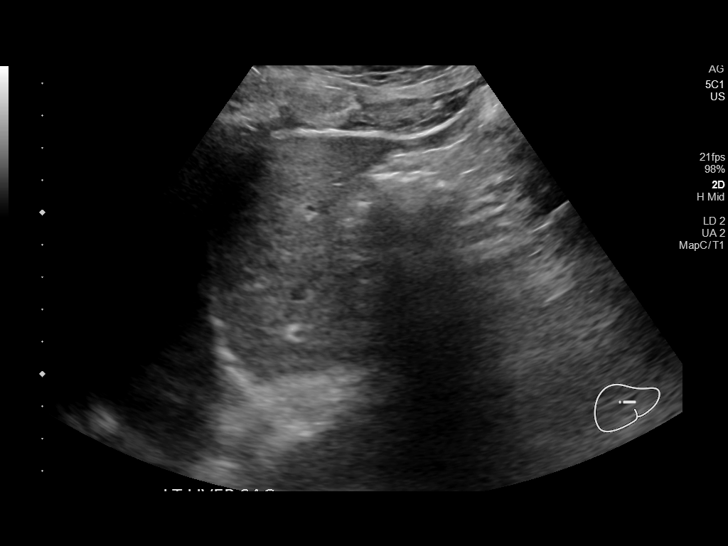
[im 30/33]
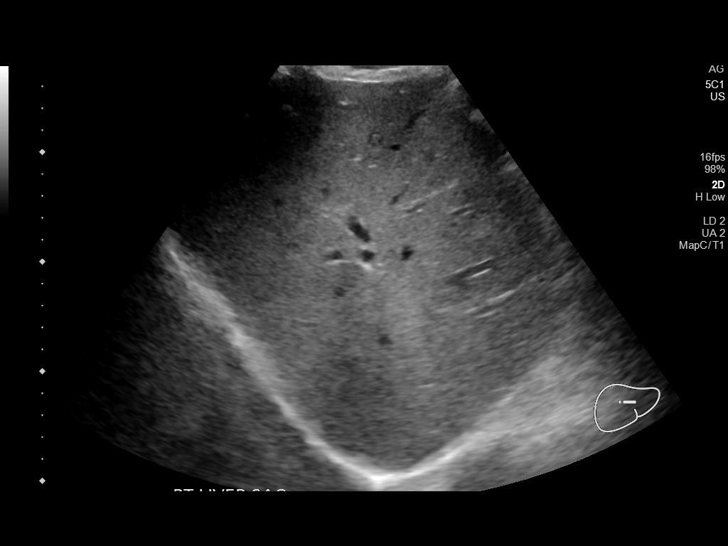
[im 33/33]
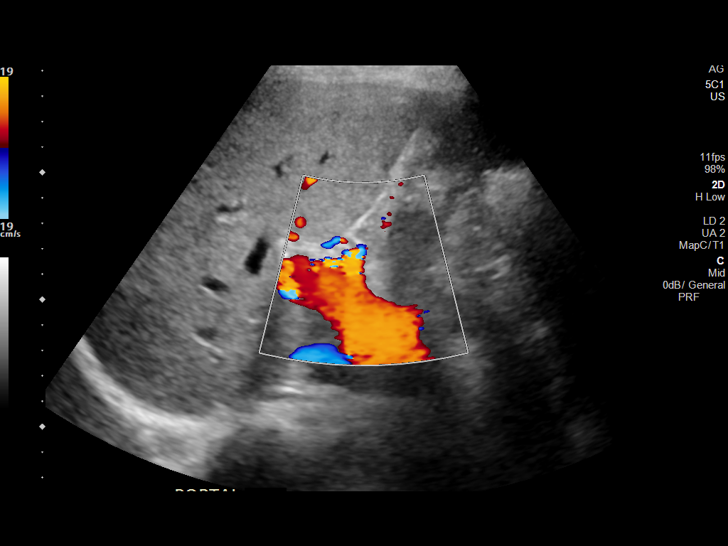

[14 of 25 positions shown; findings below may reference images not displayed]

FINDINGS: Gallbladder:

No gallstones or wall thickening visualized. No sonographic Murphy
sign noted by sonographer.

Common bile duct:

Diameter: 3 mm.

Liver:

Parenchymal echogenicity within normal. 1.7 cm cyst over the left
lobe. Portal vein is patent on color Doppler imaging with normal
direction of blood flow towards the liver.

Other: None.
IMPRESSION: 1.  No acute hepatobiliary findings.  No cholelithiasis.

2.  1.7 cm liver cyst.

## 2022-03-22 IMAGING — US US SCROTUM
1 series · 13 of 25 positions shown · non-contrast
Comparison: None.

CLINICAL DATA: Inpatient.  Right scrotal swelling for 2 weeks.

EXAM:
ULTRASOUND OF SCROTUM
TECHNIQUE: Complete ultrasound examination of the testicles, epididymis, and
other scrotal structures was performed.

[Series 1: us scrotum · 42 acquisitions, 13 frames shown]
[im 1/42]
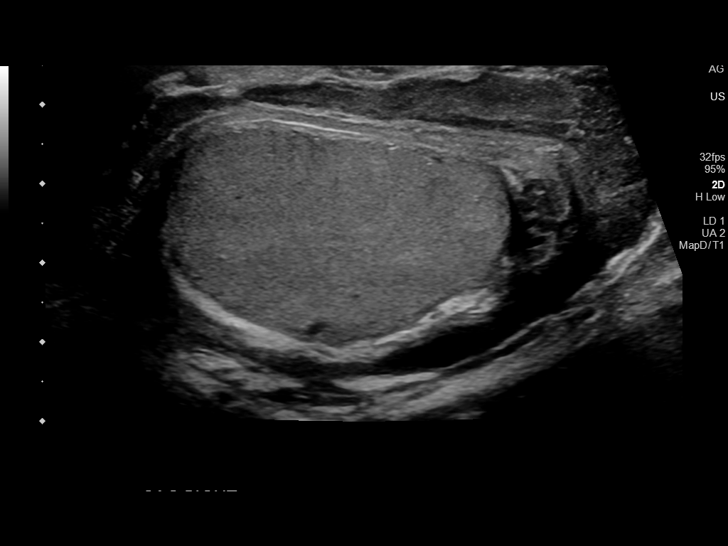
[im 4/42]
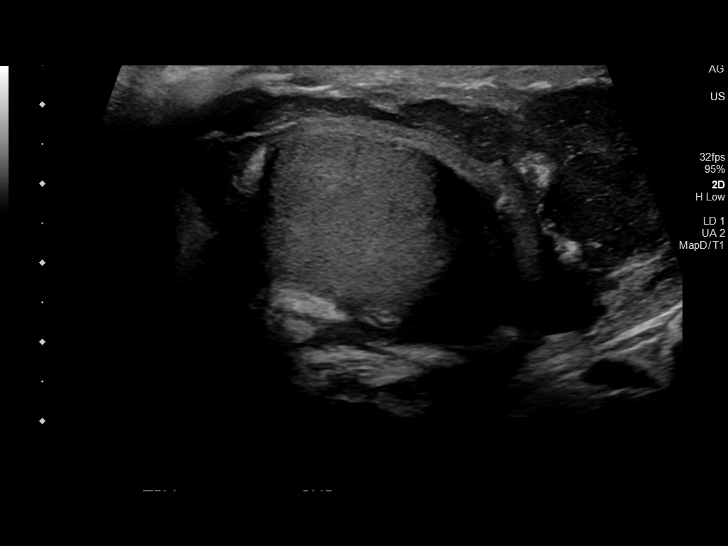
[im 7/42]
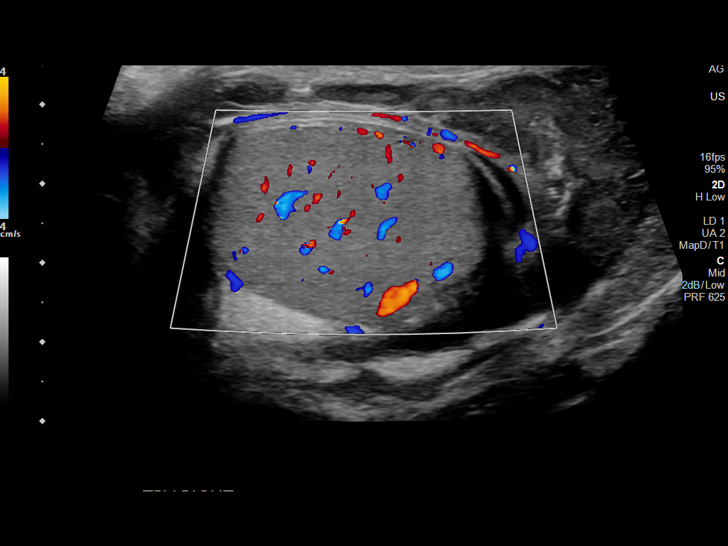
[im 11/42]
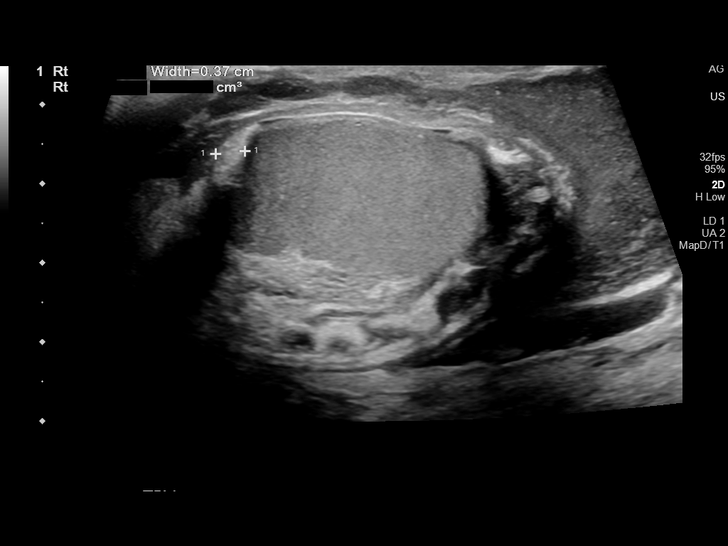
[im 14/42]
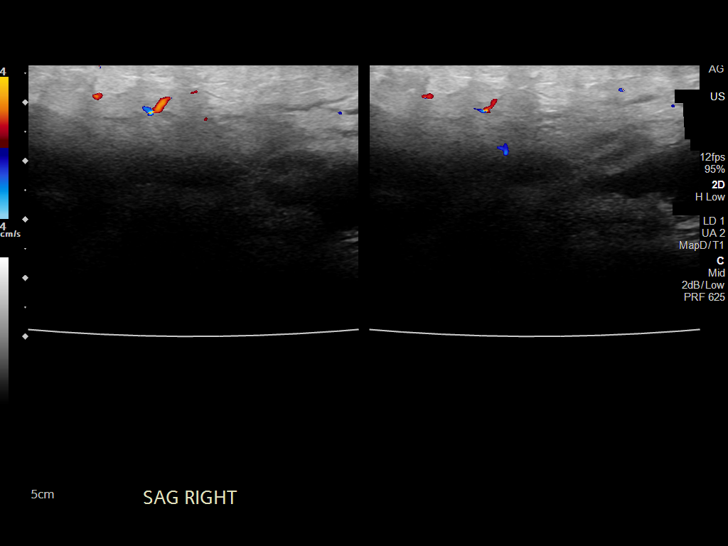
[im 18/42]
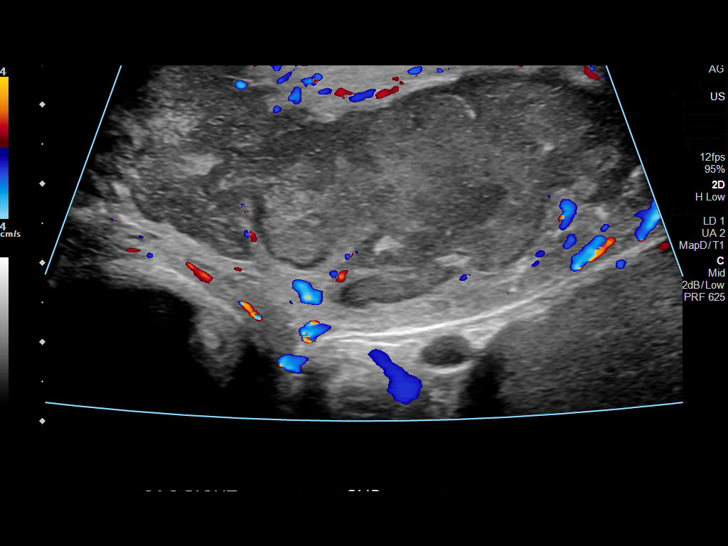
[im 21/42]
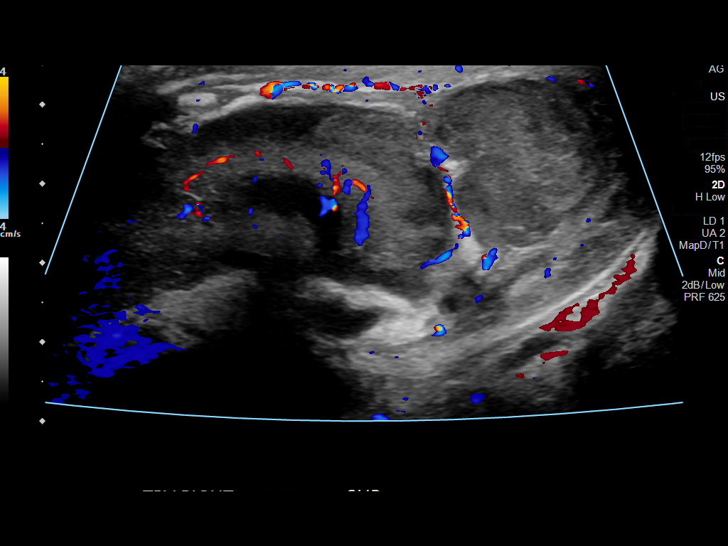
[im 24/42]
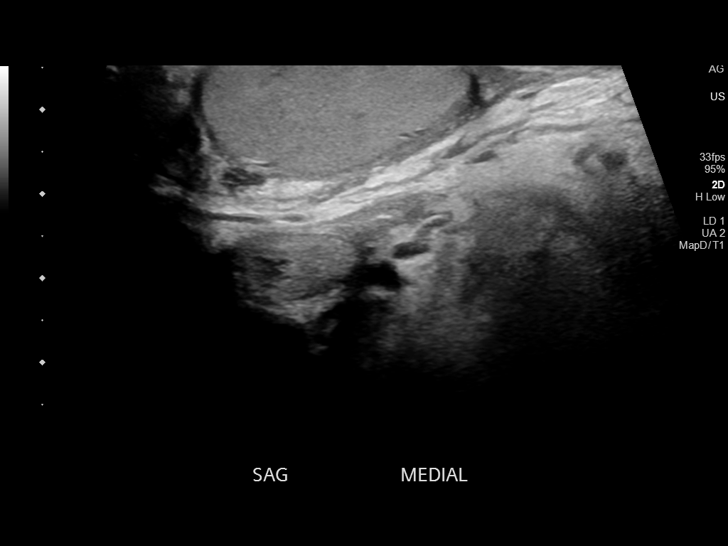
[im 28/42]
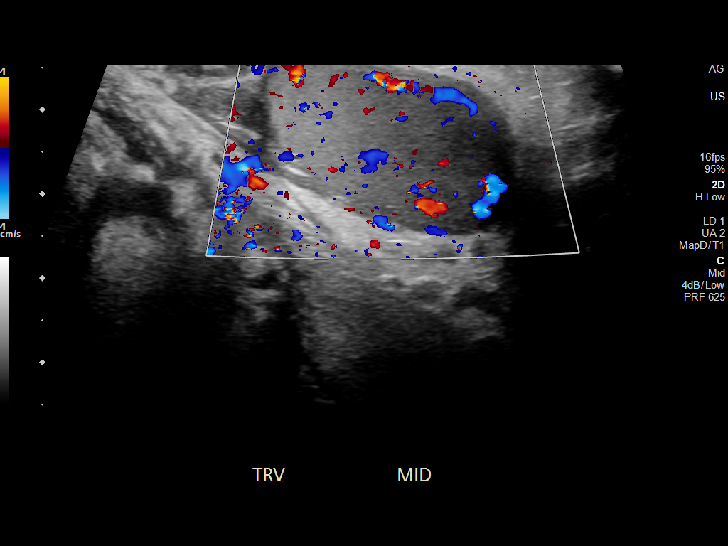
[im 31/42]
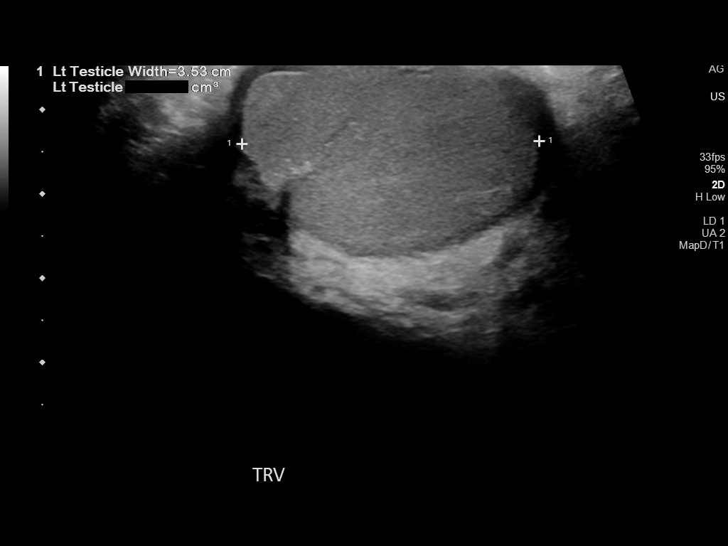
[im 35/42]
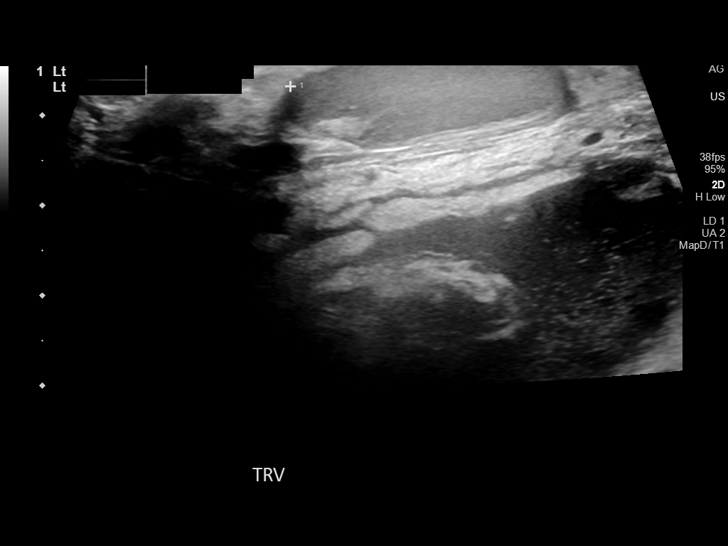
[im 38/42]
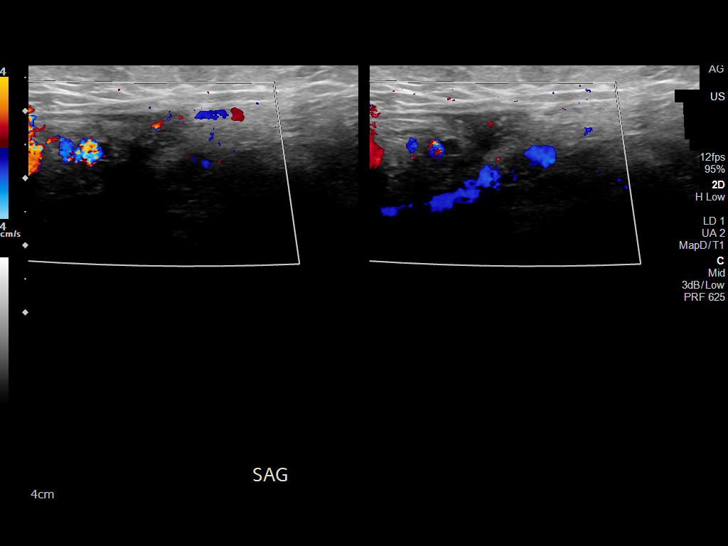
[im 42/42]
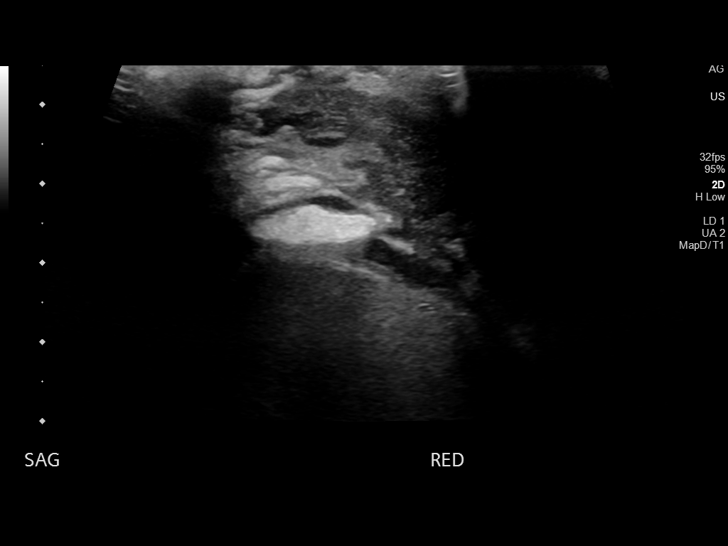

[13 of 25 positions shown; findings below may reference images not displayed]

FINDINGS: There is a complex 7.1 x 2.9 x 6.7 cm irregular fluid collection in
the deep soft tissues of the anterior superior right scrotum with
surrounding hyperemia on color Doppler, no internal vascularity and
overlying skin thickening, compatible with an abscess. This
collection appears to involve the deep subcutaneous soft tissues and
does not appear to involve the tunica vaginalis, testis or
epididymis.

Right testicle

Measurements: 4.5 x 2.6 x 3.5 cm. No mass or microlithiasis
visualized. Preserved vascularity on color Doppler in the right
testis.

Left testicle

Measurements: 3.3 x 1.6 x 3.5 cm. No mass or microlithiasis
visualized. Preserved vascularity on color Doppler in the left
testis.

Right epididymis:  Normal in size and appearance.

Left epididymis:  Normal in size and appearance.

Hydrocele:  No significant hydroceles.

Varicocele:  None visualized.
IMPRESSION: 1. Complex 7.1 x 2.9 x 6.7 cm irregular fluid collection in the deep
subcutaneous soft tissues of the anterior superior right scrotum
with surrounding hyperemia and overlying skin thickening, compatible
with abscess. This collection does not appear to involve the tunica
vaginalis, testis or epididymis.
2. Otherwise normal scrotal sonogram.  Normal testes.
